# Patient Record
Sex: Male | Born: 1940 | Race: White | Hispanic: No | Marital: Married | Smoking: Current every day smoker
Health system: Southern US, Community
[De-identification: ages and names within clinical notes are randomized; demographics above are authoritative.]

## PROBLEM LIST (undated history)

## (undated) DIAGNOSIS — G459 Transient cerebral ischemic attack, unspecified: Secondary | ICD-10-CM

## (undated) DIAGNOSIS — I10 Essential (primary) hypertension: Secondary | ICD-10-CM

## (undated) DIAGNOSIS — F32A Depression, unspecified: Secondary | ICD-10-CM

## (undated) DIAGNOSIS — I639 Cerebral infarction, unspecified: Secondary | ICD-10-CM

## (undated) DIAGNOSIS — F329 Major depressive disorder, single episode, unspecified: Secondary | ICD-10-CM

## (undated) DIAGNOSIS — E7800 Pure hypercholesterolemia, unspecified: Secondary | ICD-10-CM

---

## 1999-03-01 ENCOUNTER — Encounter: Admission: RE | Admit: 1999-03-01 | Discharge: 1999-05-30 | Payer: Self-pay | Admitting: Orthopedic Surgery

## 2010-03-29 ENCOUNTER — Encounter: Payer: Self-pay | Admitting: Vascular Surgery

## 2015-02-23 ENCOUNTER — Observation Stay (HOSPITAL_COMMUNITY)
Admission: EM | Admit: 2015-02-23 | Discharge: 2015-02-24 | Disposition: A | Discharge status: Home / Self Care | Payer: Medicare Other | Attending: Internal Medicine | Admitting: Internal Medicine

## 2015-02-23 ENCOUNTER — Emergency Department (HOSPITAL_COMMUNITY): Payer: Medicare Other

## 2015-02-23 ENCOUNTER — Encounter (HOSPITAL_COMMUNITY): Payer: Self-pay | Admitting: Emergency Medicine

## 2015-02-23 DIAGNOSIS — F17210 Nicotine dependence, cigarettes, uncomplicated: Secondary | ICD-10-CM | POA: Diagnosis not present

## 2015-02-23 DIAGNOSIS — Z7982 Long term (current) use of aspirin: Secondary | ICD-10-CM | POA: Diagnosis not present

## 2015-02-23 DIAGNOSIS — D692 Other nonthrombocytopenic purpura: Secondary | ICD-10-CM | POA: Diagnosis not present

## 2015-02-23 DIAGNOSIS — S62609A Fracture of unspecified phalanx of unspecified finger, initial encounter for closed fracture: Secondary | ICD-10-CM

## 2015-02-23 DIAGNOSIS — I10 Essential (primary) hypertension: Secondary | ICD-10-CM | POA: Diagnosis present

## 2015-02-23 DIAGNOSIS — N400 Enlarged prostate without lower urinary tract symptoms: Secondary | ICD-10-CM | POA: Diagnosis not present

## 2015-02-23 DIAGNOSIS — S2232XA Fracture of one rib, left side, initial encounter for closed fracture: Secondary | ICD-10-CM | POA: Diagnosis not present

## 2015-02-23 DIAGNOSIS — Z9181 History of falling: Secondary | ICD-10-CM | POA: Insufficient documentation

## 2015-02-23 DIAGNOSIS — I2510 Atherosclerotic heart disease of native coronary artery without angina pectoris: Secondary | ICD-10-CM | POA: Diagnosis not present

## 2015-02-23 DIAGNOSIS — K219 Gastro-esophageal reflux disease without esophagitis: Secondary | ICD-10-CM | POA: Diagnosis not present

## 2015-02-23 DIAGNOSIS — W19XXXA Unspecified fall, initial encounter: Secondary | ICD-10-CM | POA: Diagnosis not present

## 2015-02-23 DIAGNOSIS — I951 Orthostatic hypotension: Secondary | ICD-10-CM | POA: Diagnosis present

## 2015-02-23 DIAGNOSIS — I252 Old myocardial infarction: Secondary | ICD-10-CM | POA: Insufficient documentation

## 2015-02-23 DIAGNOSIS — E871 Hypo-osmolality and hyponatremia: Secondary | ICD-10-CM | POA: Diagnosis not present

## 2015-02-23 DIAGNOSIS — Z8673 Personal history of transient ischemic attack (TIA), and cerebral infarction without residual deficits: Secondary | ICD-10-CM

## 2015-02-23 DIAGNOSIS — E785 Hyperlipidemia, unspecified: Secondary | ICD-10-CM | POA: Diagnosis not present

## 2015-02-23 DIAGNOSIS — S20319A Abrasion of unspecified front wall of thorax, initial encounter: Secondary | ICD-10-CM | POA: Diagnosis not present

## 2015-02-23 DIAGNOSIS — S62601A Fracture of unspecified phalanx of left index finger, initial encounter for closed fracture: Secondary | ICD-10-CM | POA: Insufficient documentation

## 2015-02-23 DIAGNOSIS — F329 Major depressive disorder, single episode, unspecified: Secondary | ICD-10-CM | POA: Diagnosis not present

## 2015-02-23 DIAGNOSIS — E7800 Pure hypercholesterolemia, unspecified: Secondary | ICD-10-CM | POA: Diagnosis not present

## 2015-02-23 DIAGNOSIS — R531 Weakness: Secondary | ICD-10-CM | POA: Diagnosis not present

## 2015-02-23 DIAGNOSIS — Z955 Presence of coronary angioplasty implant and graft: Secondary | ICD-10-CM | POA: Diagnosis not present

## 2015-02-23 DIAGNOSIS — R7989 Other specified abnormal findings of blood chemistry: Secondary | ICD-10-CM | POA: Diagnosis not present

## 2015-02-23 DIAGNOSIS — R55 Syncope and collapse: Secondary | ICD-10-CM | POA: Diagnosis not present

## 2015-02-23 DIAGNOSIS — R269 Unspecified abnormalities of gait and mobility: Secondary | ICD-10-CM | POA: Insufficient documentation

## 2015-02-23 DIAGNOSIS — F32A Depression, unspecified: Secondary | ICD-10-CM | POA: Diagnosis present

## 2015-02-23 HISTORY — DX: Transient cerebral ischemic attack, unspecified: G45.9

## 2015-02-23 HISTORY — DX: Cerebral infarction, unspecified: I63.9

## 2015-02-23 HISTORY — DX: Depression, unspecified: F32.A

## 2015-02-23 HISTORY — DX: Pure hypercholesterolemia, unspecified: E78.00

## 2015-02-23 HISTORY — DX: Essential (primary) hypertension: I10

## 2015-02-23 HISTORY — DX: Major depressive disorder, single episode, unspecified: F32.9

## 2015-02-23 LAB — URINALYSIS, ROUTINE W REFLEX MICROSCOPIC
Bilirubin Urine: NEGATIVE
GLUCOSE, UA: 250 mg/dL — AB
Hgb urine dipstick: NEGATIVE
KETONES UR: NEGATIVE mg/dL
LEUKOCYTES UA: NEGATIVE
NITRITE: NEGATIVE
PROTEIN: NEGATIVE mg/dL
Specific Gravity, Urine: 1.012 (ref 1.005–1.030)
pH: 6.5 (ref 5.0–8.0)

## 2015-02-23 LAB — CBC
HEMATOCRIT: 47.1 % (ref 39.0–52.0)
HEMOGLOBIN: 16.5 g/dL (ref 13.0–17.0)
MCH: 34.4 pg — ABNORMAL HIGH (ref 26.0–34.0)
MCHC: 35 g/dL (ref 30.0–36.0)
MCV: 98.3 fL (ref 78.0–100.0)
Platelets: 109 10*3/uL — ABNORMAL LOW (ref 150–400)
RBC: 4.79 MIL/uL (ref 4.22–5.81)
RDW: 13 % (ref 11.5–15.5)
WBC: 6.5 10*3/uL (ref 4.0–10.5)

## 2015-02-23 LAB — HEPATIC FUNCTION PANEL
ALBUMIN: 3.7 g/dL (ref 3.5–5.0)
ALK PHOS: 81 U/L (ref 38–126)
ALT: 29 U/L (ref 17–63)
AST: 25 U/L (ref 15–41)
BILIRUBIN INDIRECT: 1 mg/dL — AB (ref 0.3–0.9)
Bilirubin, Direct: 0.2 mg/dL (ref 0.1–0.5)
TOTAL PROTEIN: 6.7 g/dL (ref 6.5–8.1)
Total Bilirubin: 1.2 mg/dL (ref 0.3–1.2)

## 2015-02-23 LAB — BASIC METABOLIC PANEL
ANION GAP: 7 (ref 5–15)
BUN: 12 mg/dL (ref 6–20)
CHLORIDE: 98 mmol/L — AB (ref 101–111)
CO2: 26 mmol/L (ref 22–32)
Calcium: 9 mg/dL (ref 8.9–10.3)
Creatinine, Ser: 1.31 mg/dL — ABNORMAL HIGH (ref 0.61–1.24)
GFR calc Af Amer: >60 mL/min (ref >60)
GFR, EST NON AFRICAN AMERICAN: 52 mL/min — AB (ref >60)
Glucose, Bld: 145 mg/dL — ABNORMAL HIGH (ref 65–99)
POTASSIUM: 4.2 mmol/L (ref 3.5–5.1)
SODIUM: 131 mmol/L — AB (ref 135–145)

## 2015-02-23 LAB — OSMOLALITY: OSMOLALITY: 268 mosm/kg — AB (ref 275–295)

## 2015-02-23 LAB — I-STAT TROPONIN, ED: Troponin i, poc: 0 ng/mL (ref 0.00–0.08)

## 2015-02-23 LAB — TSH: TSH: 1.489 u[IU]/mL (ref 0.350–4.500)

## 2015-02-23 LAB — VITAMIN B12: VITAMIN B 12: 1078 pg/mL — AB (ref 180–914)

## 2015-02-23 MED ORDER — ENOXAPARIN SODIUM 30 MG/0.3ML ~~LOC~~ SOLN
30.0000 mg | SUBCUTANEOUS | Status: DC
  Administered 2015-02-23: 30 mg via SUBCUTANEOUS
  Filled 2015-02-23: qty 0.3

## 2015-02-23 MED ORDER — ATORVASTATIN CALCIUM 10 MG PO TABS
10.0000 mg | ORAL_TABLET | Freq: Every day | ORAL | Status: DC
  Administered 2015-02-23 – 2015-02-24 (×2): 10 mg via ORAL
  Filled 2015-02-23 (×2): qty 1

## 2015-02-23 MED ORDER — ACETAMINOPHEN 325 MG PO TABS: 650.0000 mg | ORAL_TABLET | Freq: Four times a day (QID) | ORAL | Status: DC | PRN

## 2015-02-23 MED ORDER — SODIUM CHLORIDE 0.9 % IV SOLN
INTRAVENOUS | Status: AC
  Administered 2015-02-23: 22:00:00 via INTRAVENOUS

## 2015-02-23 MED ORDER — SODIUM CHLORIDE 0.9% FLUSH
3.0000 mL | Freq: Two times a day (BID) | INTRAVENOUS | Status: DC
  Administered 2015-02-23 – 2015-02-24 (×2): 3 mL via INTRAVENOUS

## 2015-02-23 MED ORDER — ACETAMINOPHEN 650 MG RE SUPP: 650.0000 mg | Freq: Four times a day (QID) | RECTAL | Status: DC | PRN

## 2015-02-23 MED ORDER — VITAMIN B-12 1000 MCG PO TABS: 1000.0000 ug | ORAL_TABLET | Freq: Every day | ORAL | Status: DC

## 2015-02-23 MED ORDER — AMITRIPTYLINE HCL 25 MG PO TABS
75.0000 mg | ORAL_TABLET | Freq: Every day | ORAL | Status: DC
  Administered 2015-02-23: 75 mg via ORAL
  Filled 2015-02-23: qty 3

## 2015-02-23 MED ORDER — ESCITALOPRAM OXALATE 10 MG PO TABS
20.0000 mg | ORAL_TABLET | Freq: Every day | ORAL | Status: DC
  Administered 2015-02-23 – 2015-02-24 (×2): 20 mg via ORAL
  Filled 2015-02-23 (×2): qty 2

## 2015-02-23 MED ORDER — TRAZODONE HCL 100 MG PO TABS
100.0000 mg | ORAL_TABLET | Freq: Every day | ORAL | Status: DC
  Administered 2015-02-23: 100 mg via ORAL
  Filled 2015-02-23: qty 1

## 2015-02-23 MED ORDER — SODIUM CHLORIDE 0.9 % IV BOLUS (SEPSIS)
1000.0000 mL | Freq: Once | INTRAVENOUS | Status: AC
  Administered 2015-02-23: 1000 mL via INTRAVENOUS

## 2015-02-23 MED ORDER — PANTOPRAZOLE SODIUM 40 MG PO TBEC
40.0000 mg | DELAYED_RELEASE_TABLET | Freq: Every day | ORAL | Status: DC
  Administered 2015-02-23 – 2015-02-24 (×2): 40 mg via ORAL
  Filled 2015-02-23 (×2): qty 1

## 2015-02-23 MED ORDER — ASPIRIN EC 81 MG PO TBEC
81.0000 mg | DELAYED_RELEASE_TABLET | Freq: Every day | ORAL | Status: DC
  Administered 2015-02-23: 81 mg via ORAL
  Filled 2015-02-23: qty 1

## 2015-02-23 NOTE — ED Notes (Signed)
Ordered heart healthy meal tray. 

## 2015-02-23 NOTE — H&P (Signed)
Date: 02/23/2015               Patient Name:  Hector Petersen MRN: [id number]  DOB: [birth year] Age / Sex: 75 y.o., male   PCP: No primary care provider on file.         Medical Service: Internal Medicine Teaching Service         Attending Physician: Dr. Doneen Poisson, MD    First Contact: Dr. Deneise Lever Pager: [phone]  Second Contact: Dr. Jill Alexanders Pager: [phone]       After Hours (After 5p/  First Contact Pager: [phone]  weekends / holidays): Second Contact Pager: [phone]   Chief Complaint: syncopal episode   History of Present Illness:   Hector Petersen is a 75 yo man with history of HTN, HLD, Depression, who is here for a syncopal episode last night. Son present at bedside who also provided the info. Patient states last night he was walking to the bathroom, lost consciousness for few seconds, and fell. He was sitting in the recliner watching TV, and then he got up and while on the way to the bathroom, he felt like this.  Denies b/b incontinence or arm twitching. Prior to the fall, patient was sitting watching television. Patient has a history of falls, which increased for last 6 months, but has never passed out before. He denies any associated chest pain or palpitations prior the event. He denies sensation of room spinning. He does admit to a recent history of productive cough with clear sputum. He has not been eating and drinking "like he used to." He says he drank plenty of water yesterday. He currently weighs 225 but several months ago weight about 240 pounds. He denies associated shortness of breath. He denies lightheadedness or dizziness prior to the fall. His wife witness the loss of consciousness and fall. Patient's face hit the floor. He normally walks around by himself. Patient has a walker and cane. Wife is currently battling stomach cancer.  He denies dizziness, headaches, SOB, n/v/d/c, melena or hematochezia. No blood loss. He does have some pain on the right side of  chest that was after the fall. He denies any fever or chills. He denies numbness or tingling but mentions that his bottom of his feet felt numb for past few months.  He also says that he feels unsteady when he walks and that has resulted in a lot of falls.  In the last 6 months, he has fallen several times. He has been evaluated by PT before and he has a walker at home, but he does not like using a walker, and likes to walk by himself.   In the ER, he was found to be orthostatic and given 1L fluids. CT head was WNL. EKG showed sinus tach.    Social Hx: Patient lives at home with his wife. He admits to alcohol use- usually one drink a day either beer or wine. He admits to 1 ppd of tobacco use for the last one year. He previously smoked for 40 years then quit, but has been smoking for the past year.   PMHx: HTN, h/o CVA, Depression, HLD  Family Hx: Sister- Hypothyroidism   Meds: Current Facility-Administered Medications  Medication Dose Route Frequency Provider Last Rate Last Dose  . 0.9 %  sodium chloride infusion   Intravenous Continuous Alexa Dulcy Fanny, MD      . acetaminophen (TYLENOL) tablet 650 mg  650 mg Oral Q6H PRN Alexa Dulcy Fanny, MD  Or  . acetaminophen (TYLENOL) suppository 650 mg  650 mg Rectal Q6H PRN Alexa Dulcy Fanny, MD      . amitriptyline (ELAVIL) tablet 75 mg  75 mg Oral QHS Alexa Dulcy Fanny, MD      . aspirin EC tablet 81 mg  81 mg Oral Daily Alexa Dulcy Fanny, MD      . atorvastatin (LIPITOR) tablet 10 mg  10 mg Oral Daily Alexa Dulcy Fanny, MD      . enoxaparin (LOVENOX) injection 30 mg  30 mg Subcutaneous Q24H Alexa Dulcy Fanny, MD      . escitalopram (LEXAPRO) tablet 20 mg  20 mg Oral Daily Alexa Dulcy Fanny, MD      . pantoprazole (PROTONIX) EC tablet 40 mg  40 mg Oral Daily Alexa Dulcy Fanny, MD      . sodium chloride flush (NS) 0.9 % injection 3 mL  3 mL Intravenous Q12H Alexa Dulcy Fanny, MD      . traZODone (DESYREL) tablet 100 mg  100 mg  Oral QHS Alexa Dulcy Fanny, MD      . vitamin B-12 (CYANOCOBALAMIN) tablet 1,000 mcg  1,000 mcg Oral Daily Alexa Dulcy Fanny, MD        Allergies: Allergies as of 02/23/2015  . (No Known Allergies)   Past Medical History  Diagnosis Date  . Hypertension   . Hypercholesteremia   . Depression   . TIA (transient ischemic attack)   . Stroke [facility])    No past surgical history on file. No family history on file. Social History   Social History  . Marital Status: Married    Spouse Name: N/A  . Number of Children: N/A  . Years of Education: N/A   Occupational History  . Not on file.   Social History Main Topics  . Smoking status: Current Every Day Smoker  . Smokeless tobacco: Not on file  . Alcohol Use: Yes  . Drug Use: No  . Sexual Activity: Not on file   Other Topics Concern  . Not on file   Social History Narrative  . No narrative on file    Review of Systems: Pertinent items noted in HPI and remainder of comprehensive ROS otherwise negative.  Physical Exam: Blood pressure 140/82, pulse 83, temperature 98.3 F (36.8 C), temperature source Oral, resp. rate 18, SpO2 98 %.  General: Vital signs reviewed. Patient in no acute distress HEENT: left forehead has some swelling Cardiovascular: regular rate, rhythm, no murmur appreciated     Pulmonary/Chest: Clear to auscultation bilaterally, no wheezes, rales, or rhonchi. Abdominal: Soft, non-tender, non-distended, BS + Extremities: trace lower extremity edema bilaterally, pulses symmetric and intact bilaterally. Skin: has some bruising on shins and on chest.   Neurologic exam: MS: A&O to person, time, place,  CN II-XII grossly intact DTRs: 2+ and symmetric Sensory: intact to light touch and feels same on both sides Motor: 5/5 strength in upper, 5/5 in lower extremities, normal muscle tone Cerebellar: , normal FNF,  Psyc: pleasant personality, affect appropriate to mood, no focal deficits     Lab  results: Results for orders placed or performed during the hospital encounter of 02/23/15 (from the past 24 hour(s))  Basic metabolic panel     Status: Abnormal   Collection Time: 02/23/15 11:19 AM  Result Value Ref Range   Sodium 131 (L) 135 - 145 mmol/L   Potassium 4.2 3.5 - 5.1 mmol/L   Chloride 98 (L) 101 - 111 mmol/L   CO2 26  22 - 32 mmol/L   Glucose, Bld 145 (H) 65 - 99 mg/dL   BUN 12 6 - 20 mg/dL   Creatinine, Ser 0.98 (H) 0.61 - 1.24 mg/dL   Calcium 9.0 8.9 - 11.9 mg/dL   GFR calc non Af Amer 52 (L) >60 mL/min   GFR calc Af Amer >60 >60 mL/min   Anion gap 7 5 - 15  CBC     Status: Abnormal   Collection Time: 02/23/15 11:19 AM  Result Value Ref Range   WBC 6.5 4.0 - 10.5 K/uL   RBC 4.79 4.22 - 5.81 MIL/uL   Hemoglobin 16.5 13.0 - 17.0 g/dL   HCT 14.7 82.9 - 56.2 %   MCV 98.3 78.0 - 100.0 fL   MCH 34.4 (H) 26.0 - 34.0 pg   MCHC 35.0 30.0 - 36.0 g/dL   RDW 13.0 86.5 - 78.4 %   Platelets 109 (L) 150 - 400 K/uL  Hepatic function panel     Status: Abnormal   Collection Time: 02/23/15 11:20 AM  Result Value Ref Range   Total Protein 6.7 6.5 - 8.1 g/dL   Albumin 3.7 3.5 - 5.0 g/dL   AST 25 15 - 41 U/L   ALT 29 17 - 63 U/L   Alkaline Phosphatase 81 38 - 126 U/L   Total Bilirubin 1.2 0.3 - 1.2 mg/dL   Bilirubin, Direct 0.2 0.1 - 0.5 mg/dL   Indirect Bilirubin 1.0 (H) 0.3 - 0.9 mg/dL  I-Stat Troponin, ED (not at [facility])     Status: None   Collection Time: 02/23/15 11:27 AM  Result Value Ref Range   Troponin i, poc 0.00 0.00 - 0.08 ng/mL   Comment 3          Urinalysis, Routine w reflex microscopic     Status: Abnormal   Collection Time: 02/23/15  1:00 PM  Result Value Ref Range   Color, Urine YELLOW YELLOW   APPearance CLOUDY (A) CLEAR   Specific Gravity, Urine 1.012 1.005 - 1.030   pH 6.5 5.0 - 8.0   Glucose, UA 250 (A) NEGATIVE mg/dL   Hgb urine dipstick NEGATIVE NEGATIVE   Bilirubin Urine NEGATIVE NEGATIVE   Ketones, ur NEGATIVE NEGATIVE mg/dL   Protein, ur  NEGATIVE NEGATIVE mg/dL   Nitrite NEGATIVE NEGATIVE   Leukocytes, UA NEGATIVE NEGATIVE     Imaging results:  Dg Chest 2 View  02/23/2015  CLINICAL DATA:  Fall last night, face and arms abrasion however EXAM: CHEST  2 VIEW COMPARISON:  11/26/2014 FINDINGS: Cardiomediastinal silhouette is stable. Again noted old fracture of the left fifth rib. No acute infiltrate or pulmonary edema. Osteopenia and mild degenerative changes thoracic spine. No pneumothorax. IMPRESSION: No active disease. Again noted old fracture of the left fifth rib. No infiltrate or pulmonary edema. Electronically Signed   By: Natasha Mead M.D.   On: 02/23/2015 12:17   Ct Head Wo Contrast  02/23/2015  CLINICAL DATA:  Syncope last night with a fall and blow to the face. Initial encounter. EXAM: CT HEAD WITHOUT CONTRAST CT CERVICAL SPINE WITHOUT CONTRAST TECHNIQUE: Multidetector CT imaging of the head and cervical spine was performed following the standard protocol without intravenous contrast. Multiplanar CT image reconstructions of the cervical spine were also generated. COMPARISON:  Head CT scan 05/25/2012. FINDINGS: CT HEAD FINDINGS Hematoma is seen above the left eye. There is some cortical atrophy and chronic microvascular ischemic change. No evidence of acute intracranial abnormality including hemorrhage, infarct, mass lesion, mass effect,  midline shift or abnormal extra-axial fluid collection is identified. 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 No pneumocephalus or hydrocephalus. The calvarium is intact. Mucosal thickening is seen in the maxillary sinuses bilaterally and scattered ethmoid air cells. CT CERVICAL SPINE FINDINGS No fracture or malalignment cervical spine is identified. Mild loss of disc space height and endplate spurring are seen at C5-6. Note is made that the occipital condyles and lateral masses of C1 are autologously fused. Lung apices are clear. Paraspinous structures are  unremarkable. IMPRESSION: Hematoma above the left eye without underlying fracture or acute intracranial abnormality. No acute abnormality cervical spine. Atrophy and chronic microvascular ischemic change. Circumferential mucosal thickening maxillary sinuses bilaterally. Mild degenerative disease C5-6. Electronically Signed   By: Drusilla Kanner M.D.   On: 02/23/2015 12:17   Ct Cervical Spine Wo Contrast  02/23/2015  CLINICAL DATA:  Syncope last night with a fall and blow to the face. Initial encounter. EXAM: CT HEAD WITHOUT CONTRAST CT CERVICAL SPINE WITHOUT CONTRAST TECHNIQUE: Multidetector CT imaging of the head and cervical spine was performed following the standard protocol without intravenous contrast. Multiplanar CT image reconstructions of the cervical spine were also generated. COMPARISON:  Head CT scan 05/25/2012. FINDINGS: CT HEAD FINDINGS Hematoma is seen above the left eye. There is some cortical atrophy and chronic microvascular ischemic change. No evidence of acute intracranial abnormality including hemorrhage, infarct, mass lesion, mass effect, midline shift or abnormal extra-axial fluid collection is identified. 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 No pneumocephalus or hydrocephalus. The calvarium is intact. Mucosal thickening is seen in the maxillary sinuses bilaterally and scattered ethmoid air cells. CT CERVICAL SPINE FINDINGS No fracture or malalignment cervical spine is identified. Mild loss of disc space height and endplate spurring are seen at C5-6. Note is made that the occipital condyles and lateral masses of C1 are autologously fused. Lung apices are clear. Paraspinous structures are unremarkable. IMPRESSION: Hematoma above the left eye without underlying fracture or acute intracranial abnormality. No acute abnormality cervical spine. Atrophy and chronic microvascular ischemic change. Circumferential mucosal thickening maxillary sinuses  bilaterally. Mild degenerative disease C5-6. Electronically Signed   By: Drusilla Kanner M.D.   On: 02/23/2015 12:17    Other results:   Assessment & Plan by Problem: Active Problems:   Syncope and collapse  Syncopal episode leading to fall : Pt with a syncopal episode that had associated loss of consciousness for few seconds leading to a fall witnessed by wife. Differentials would be vasovagal, orthostatic, cardiac etiology, or neurologic. There were no preceding symptoms but denied palpitations or chest pain so could be cardiac etiology. No room spinning sensation so unlikely to be BPPV, and no pallor or diaphoresis upon getting up from the recliner so unlikely to be vasovagal   Pt says he was adequately hydrated yesterday and had his normal meals. He also denied any extremity weakness. So concern for cardiogenic syncope. Unlikely to be CVA or PE.  In the ER< he was found to be orthostatic and given 1 L fluid. CT head was unremarkable. CT spine was unremarkable. Chest xray shows old fracture of left fifth rib . Neuro exam was entirely unremarkable. EKG showed sinus tach and some ST abnormality but no overt T wave inversion or st changes.   -telemetry -NS 100 cc/hr, received 1 L bolus -PT and OT eval and treat   Follow up: -repeat CBC and BMET  -follow up TSH -PT and OT eval  -  echo -morning EKG follow up    Recurrent mechanical falls: Pt who has fallen multiple times in the past 6 months due to "his legs giving out" Has a walker and cane at home but did not want to use it. Physical exam reveals multiple bruising and band-aids. No concern for abuse at home as son seemed very supportive.  Chest xray shows old fracture of left fifth rib   -PT and OT eval -f/u Vit B12   Depression: currently lot of things going on in family- wife dx with gastric ca.  Could have contributed to the fall.  -elavil 75 daily at bedtime  Trazodone for sleep -lexapro  Hyponatremia: to 131. Asymptomatic . No  priors for comparison  -on NS 100 cc/hr -repeat BMET  -serum osms calculating  , urine Na and Urine osms  Elevated Creatinine: to 1.31. No baseline  -repeat BMET -FeNa   GERD -protonix  HLD -lipitor 10 mg daily    Dispo: Disposition is deferred at this time, awaiting improvement of current medical problems. Anticipated discharge in approximately 2 day(s).   The patient does have a current PCP (No primary care provider on file.) and does need an [facility] hospital follow-up appointment after discharge.  The patient does not have transportation limitations that hinder transportation to clinic appointments.  Signed: Deneise Lever, MD 02/23/2015, 6:51 PM

## 2015-02-23 NOTE — ED Provider Notes (Signed)
CSN: [id number]     Arrival date & time 02/23/15  1109 History   First MD Initiated Contact with Patient 02/23/15 1121     Chief Complaint  Patient presents with  . Loss of Consciousness     Patient is a 75 y.o. male presenting with syncope. The history is provided by the patient. No language interpreter was used.  Loss of Consciousness  MACOY RODWELL is a 75 y.o. male who presents to the Emergency Department complaining of syncope.   History is provided by the patient and his wife. Last night he was walking to the bathroom and he had a syncopal event landing on his face. He had very brief loss of consciousness. He had no preceding symptoms. He reports pain to his face. His family doctor was called today and they told him to present to the ED for evaluation. Current upper respiratory infection with prednsone and antibiotics one week ago. He's had a cough but feels like this is improving. He does have some left-sided chest discomfort related to prior fall. He denies any fevers, hemoptysis, leg swelling or pain.  Past Medical History  Diagnosis Date  . Hypertension   . Hypercholesteremia   . Depression   . TIA (transient ischemic attack)   . Stroke [facility])    No past surgical history on file. No family history on file. Social History  Substance Use Topics  . Smoking status: Current Every Day Smoker  . Smokeless tobacco: Not on file  . Alcohol Use: Yes    Review of Systems  Cardiovascular: Positive for syncope.  All other systems reviewed and are negative.     Allergies  Review of patient's allergies indicates no known allergies.  Home Medications   Prior to Admission medications   Medication Sig Start Date End Date Taking? Authorizing Provider  amitriptyline (ELAVIL) 50 MG tablet Take 75 mg by mouth at bedtime.   Yes Historical Provider, MD  aspirin EC 81 MG tablet Take 81 mg by mouth daily.   Yes Historical Provider, MD  atorvastatin (LIPITOR) 10 MG tablet Take 10 mg by  mouth daily.   Yes Historical Provider, MD  escitalopram (LEXAPRO) 20 MG tablet Take 20 mg by mouth daily.   Yes Historical Provider, MD  lisinopril (PRINIVIL,ZESTRIL) 20 MG tablet Take 20 mg by mouth daily.   Yes Historical Provider, MD  Omega-3 Fatty Acids (FISH OIL) 1000 MG CAPS Take 1,000 mg by mouth daily.   Yes Historical Provider, MD  omeprazole (PRILOSEC) 20 MG capsule Take 20 mg by mouth daily.   Yes Historical Provider, MD  tamsulosin (FLOMAX) 0.4 MG CAPS capsule Take 0.4 mg by mouth at bedtime.   Yes Historical Provider, MD  traZODone (DESYREL) 100 MG tablet Take 100-200 mg by mouth at bedtime.   Yes Historical Provider, MD  vitamin B-12 (CYANOCOBALAMIN) 1000 MCG tablet Take 1,000 mcg by mouth daily.   Yes Historical Provider, MD   BP 158/84 mmHg  Pulse 84  Temp(Src) 98.6 F (37 C) (Oral)  Resp 18  SpO2 97% Physical Exam  Constitutional: He is oriented to person, place, and time. He appears well-developed and well-nourished.  HENT:  Head: Normocephalic.   Small amount of swelling and ecchymosis to the left forehead.  Cardiovascular: Regular rhythm.   No murmur heard. tachycardic  Pulmonary/Chest: Effort normal and breath sounds normal. No respiratory distress.  Abdominal: Soft. There is no tenderness. There is no rebound and no guarding.  Musculoskeletal: He exhibits no edema or  tenderness.  Neurological: He is alert and oriented to person, place, and time. No cranial nerve deficit.   5 out of 5 strength in all 4 extremities  Skin: Skin is warm and dry.  Psychiatric: He has a normal mood and affect. His behavior is normal.  Nursing note and vitals reviewed.   ED Course  Procedures (including critical care time) Labs Review Labs Reviewed  BASIC METABOLIC PANEL - Abnormal; Notable for the following:    Sodium 131 (*)    Chloride 98 (*)    Glucose, Bld 145 (*)    Creatinine, Ser 1.31 (*)    GFR calc non Af Amer 52 (*)    All other components within normal limits   CBC - Abnormal; Notable for the following:    MCH 34.4 (*)    Platelets 109 (*)    All other components within normal limits  HEPATIC FUNCTION PANEL - Abnormal; Notable for the following:    Indirect Bilirubin 1.0 (*)    All other components within normal limits  URINALYSIS, ROUTINE W REFLEX MICROSCOPIC (NOT AT [facility]) - Abnormal; Notable for the following:    APPearance CLOUDY (*)    Glucose, UA 250 (*)    All other components within normal limits  I-STAT TROPOININ, ED    Imaging Review Dg Chest 2 View  02/23/2015  CLINICAL DATA:  Fall last night, face and arms abrasion however EXAM: CHEST  2 VIEW COMPARISON:  11/26/2014 FINDINGS: Cardiomediastinal silhouette is stable. Again noted old fracture of the left fifth rib. No acute infiltrate or pulmonary edema. Osteopenia and mild degenerative changes thoracic spine. No pneumothorax. IMPRESSION: No active disease. Again noted old fracture of the left fifth rib. No infiltrate or pulmonary edema. Electronically Signed   By: Natasha Mead M.D.   On: 02/23/2015 12:17   Ct Head Wo Contrast  02/23/2015  CLINICAL DATA:  Syncope last night with a fall and blow to the face. Initial encounter. EXAM: CT HEAD WITHOUT CONTRAST CT CERVICAL SPINE WITHOUT CONTRAST TECHNIQUE: Multidetector CT imaging of the head and cervical spine was performed following the standard protocol without intravenous contrast. Multiplanar CT image reconstructions of the cervical spine were also generated. COMPARISON:  Head CT scan 05/25/2012. FINDINGS: CT HEAD FINDINGS Hematoma is seen above the left eye. There is some cortical atrophy and chronic microvascular ischemic change. No evidence of acute intracranial abnormality including hemorrhage, infarct, mass lesion, mass effect, midline shift or abnormal extra-axial fluid collection is identified. 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 No pneumocephalus or hydrocephalus. The calvarium is intact. Mucosal  thickening is seen in the maxillary sinuses bilaterally and scattered ethmoid air cells. CT CERVICAL SPINE FINDINGS No fracture or malalignment cervical spine is identified. Mild loss of disc space height and endplate spurring are seen at C5-6. Note is made that the occipital condyles and lateral masses of C1 are autologously fused. Lung apices are clear. Paraspinous structures are unremarkable. IMPRESSION: Hematoma above the left eye without underlying fracture or acute intracranial abnormality. No acute abnormality cervical spine. Atrophy and chronic microvascular ischemic change. Circumferential mucosal thickening maxillary sinuses bilaterally. Mild degenerative disease C5-6. Electronically Signed   By: Drusilla Kanner M.D.   On: 02/23/2015 12:17   Ct Cervical Spine Wo Contrast  02/23/2015  CLINICAL DATA:  Syncope last night with a fall and blow to the face. Initial encounter. EXAM: CT HEAD WITHOUT CONTRAST CT CERVICAL SPINE WITHOUT CONTRAST TECHNIQUE: Multidetector CT imaging of the head and  cervical spine was performed following the standard protocol without intravenous contrast. Multiplanar CT image reconstructions of the cervical spine were also generated. COMPARISON:  Head CT scan 05/25/2012. FINDINGS: CT HEAD FINDINGS Hematoma is seen above the left eye. There is some cortical atrophy and chronic microvascular ischemic change. No evidence of acute intracranial abnormality including hemorrhage, infarct, mass lesion, mass effect, midline shift or abnormal extra-axial fluid collection is identified. 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 No pneumocephalus or hydrocephalus. The calvarium is intact. Mucosal thickening is seen in the maxillary sinuses bilaterally and scattered ethmoid air cells. CT CERVICAL SPINE FINDINGS No fracture or malalignment cervical spine is identified. Mild loss of disc space height and endplate spurring are seen at C5-6. Note is made that the  occipital condyles and lateral masses of C1 are autologously fused. Lung apices are clear. Paraspinous structures are unremarkable. IMPRESSION: Hematoma above the left eye without underlying fracture or acute intracranial abnormality. No acute abnormality cervical spine. Atrophy and chronic microvascular ischemic change. Circumferential mucosal thickening maxillary sinuses bilaterally. Mild degenerative disease C5-6. Electronically Signed   By: Drusilla Kanner M.D.   On: 02/23/2015 12:17   I have personally reviewed and evaluated these images and lab results as part of my medical decision-making.   EKG Interpretation   Date/Time:  Tuesday February 23 2015 11:17:33 EST Ventricular Rate:  105 PR Interval:  178 QRS Duration: 108 QT Interval:  318 QTC Calculation: 420 R Axis:   50 Text Interpretation:  Sinus tachycardia ST \\T \ T wave abnormality,  consider inferior ischemia Abnormal ECG Confirmed by Lincoln Brigham [phone]) on  02/23/2015 12:03:19 PM      MDM   Final diagnoses:  Syncope and collapse    patient here for evaluation of injuries following a syncopal event last night. He does have a frontal contusion on examination. No evidence of intracranial abnormality on CT scan. Labs are similar compared to priors.  Patient is orthostatic in the department and mildly tachycardic on ED arrival. His heart rates improved after IV fluid bolus.Concern for potential cardiogenic syncope given his lack of preceding symptoms. Discuss with medicine regarding admission for observation for further evaluation.   Tilden Fossa, MD 02/23/15 1622

## 2015-02-23 NOTE — ED Notes (Signed)
Pt had syncope last night with fall onto face; pt with cuts and abrasion to arms and bridge of nose

## 2015-02-24 ENCOUNTER — Other Ambulatory Visit: Payer: Self-pay | Admitting: Internal Medicine

## 2015-02-24 ENCOUNTER — Ambulatory Visit (HOSPITAL_BASED_OUTPATIENT_CLINIC_OR_DEPARTMENT_OTHER): Payer: Medicare Other

## 2015-02-24 ENCOUNTER — Observation Stay (HOSPITAL_COMMUNITY): Payer: Medicare Other

## 2015-02-24 ENCOUNTER — Other Ambulatory Visit: Payer: Self-pay

## 2015-02-24 DIAGNOSIS — S62609A Fracture of unspecified phalanx of unspecified finger, initial encounter for closed fracture: Secondary | ICD-10-CM | POA: Insufficient documentation

## 2015-02-24 DIAGNOSIS — D692 Other nonthrombocytopenic purpura: Secondary | ICD-10-CM | POA: Diagnosis present

## 2015-02-24 DIAGNOSIS — R55 Syncope and collapse: Secondary | ICD-10-CM | POA: Diagnosis not present

## 2015-02-24 DIAGNOSIS — I10 Essential (primary) hypertension: Secondary | ICD-10-CM | POA: Diagnosis not present

## 2015-02-24 DIAGNOSIS — E785 Hyperlipidemia, unspecified: Secondary | ICD-10-CM | POA: Diagnosis not present

## 2015-02-24 DIAGNOSIS — Z8673 Personal history of transient ischemic attack (TIA), and cerebral infarction without residual deficits: Secondary | ICD-10-CM | POA: Diagnosis not present

## 2015-02-24 LAB — CBC
HEMATOCRIT: 41.2 % (ref 39.0–52.0)
Hemoglobin: 14.6 g/dL (ref 13.0–17.0)
MCH: 34.6 pg — AB (ref 26.0–34.0)
MCHC: 35.4 g/dL (ref 30.0–36.0)
MCV: 97.6 fL (ref 78.0–100.0)
Platelets: 74 10*3/uL — ABNORMAL LOW (ref 150–400)
RBC: 4.22 MIL/uL (ref 4.22–5.81)
RDW: 12.9 % (ref 11.5–15.5)
WBC: 3.7 10*3/uL — AB (ref 4.0–10.5)

## 2015-02-24 LAB — BASIC METABOLIC PANEL
ANION GAP: 6 (ref 5–15)
BUN: 10 mg/dL (ref 6–20)
CHLORIDE: 99 mmol/L — AB (ref 101–111)
CO2: 26 mmol/L (ref 22–32)
Calcium: 8.5 mg/dL — ABNORMAL LOW (ref 8.9–10.3)
Creatinine, Ser: 1.16 mg/dL (ref 0.61–1.24)
GFR calc Af Amer: >60 mL/min (ref >60)
GLUCOSE: 115 mg/dL — AB (ref 65–99)
POTASSIUM: 4.5 mmol/L (ref 3.5–5.1)
Sodium: 131 mmol/L — ABNORMAL LOW (ref 135–145)

## 2015-02-24 LAB — SODIUM, URINE, RANDOM: Sodium, Ur: 49 mmol/L

## 2015-02-24 LAB — CREATININE, URINE, RANDOM: Creatinine, Urine: 47.93 mg/dL

## 2015-02-24 LAB — OSMOLALITY, URINE: OSMOLALITY UR: 221 mosm/kg — AB (ref 300–900)

## 2015-02-24 MED ORDER — LISINOPRIL 20 MG PO TABS: 10.0000 mg | ORAL_TABLET | Freq: Every day | ORAL | Status: AC

## 2015-02-24 NOTE — Evaluation (Signed)
Physical Therapy Evaluation Patient Details Name: Hector Petersen MRN: [id number] DOB: March 17, 1940 Today's Date: 02/24/2015   History of Present Illness  Hector Petersen is a 75 yo man with history of HTN, HLD, Depression, who is here for a syncopal episode last night.  He also says that he feels unsteady when he walks and that has resulted in a lot of falls.  Chest xray shows old fracture of left fifth rib   Clinical Impression  Patient presents with decreased mobility due to deficits listed in PT problem list.  He will benefit from skilled PT in the outpatient setting to address deficits in balance and safety and minimize fall risk.     Follow Up Recommendations Outpatient PT    Equipment Recommendations  None recommended by PT    Recommendations for Other Services       Precautions / Restrictions Precautions Precautions: Fall Precaution Comments: Hector Petersen reports 3 falls in January       Mobility  Bed Mobility Overal bed mobility: Needs Assistance Bed Mobility: Supine to Sit     Supine to sit: Supervision     General bed mobility comments: increased time and UE assist needed on rail  Transfers Overall transfer level: Needs assistance   Transfers: Sit to/from Stand Sit to Stand: Supervision         General transfer comment: assist for safety, no LOB, UE support on bed for transfer  Ambulation/Gait Ambulation/Gait assistance: Supervision Ambulation Distance (Feet): 200 Feet Assistive device: None Gait Pattern/deviations: Step-through pattern     General Gait Details: see DGI  Stairs Stairs: Yes Stairs assistance: Supervision Stair Management: Alternating pattern;Two rails;One rail Right;Forwards Number of Stairs: 4 General stair comments: one rail to ascend, two to descend  Wheelchair Mobility    Modified Rankin (Stroke Patients Only)       Balance                                 Standardized Balance Assessment Standardized Balance  Assessment : Berg Balance Test;Dynamic Gait Index Berg Balance Test Sit to Stand: Able to stand  independently using hands Standing Unsupported: Able to stand safely 2 minutes Sitting with Back Unsupported but Feet Supported on Floor or Stool: Able to sit safely and securely 2 minutes Stand to Sit: Sits safely with minimal use of hands Transfers: Able to transfer safely, minor use of hands Standing Unsupported with Eyes Closed: Able to stand 10 seconds with supervision Standing Ubsupported with Feet Together: Able to place feet together independently and stand for 1 minute with supervision From Standing, Reach Forward with Outstretched Arm: Can reach confidently >25 cm (10") From Standing Position, Pick up Object from Floor: Unable to pick up and needs supervision From Standing Position, Turn to Look Behind Over each Shoulder: Looks behind from both sides and weight shifts well Turn 360 Degrees: Able to turn 360 degrees safely but slowly Standing Unsupported, Alternately Place Feet on Step/Stool: Able to complete 4 steps without aid or supervision Standing Unsupported, One Foot in Front: Able to plae foot ahead of the other independently and hold 30 seconds Standing on One Leg: Tries to lift leg/unable to hold 3 seconds but remains standing independently Total Score: 42 Dynamic Gait Index Level Surface: Normal Change in Gait Speed: Mild Impairment Gait with Horizontal Head Turns: Mild Impairment Gait with Vertical Head Turns: Normal Gait and Pivot Turn: Normal Step Over Obstacle: Normal Step Around Obstacles: Normal  Steps: Mild Impairment Total Score: 21       Pertinent Vitals/Pain Pain Assessment: No/denies pain    Home Living Family/patient expects to be discharged to:: Private residence Living Arrangements: Spouse/significant other Available Help at Discharge: Family Type of Home: House Home Access: Stairs to enter Entrance Stairs-Rails: None (son reports can install  rails) Entrance Stairs-Number of Steps: 3 Home Layout: Able to live on main level with bedroom/bathroom;Laundry or work area in basement;One level Home Equipment: Grab bars - tub/shower;Cane - single point;Walker - 2 wheels;Walker - 4 wheels      Prior Function Level of Independence: Independent               Hand Dominance   Dominant Hand: Right    Extremity/Trunk Assessment               Lower Extremity Assessment: RLE deficits/detail;LLE deficits/detail RLE Deficits / Details: AROM WFL, strength hip flexion 4-/5, knee extension 4+/5, ankle DF 4+/5, coordination intact to toe tapping and heel to shin LLE Deficits / Details: AROM WFL, strength hip flexion 4/5, knee extension 4+/5, ankle DF 4+/5, coordination intact to toe tapping and heel to shin     Communication   Communication: No difficulties  Cognition Arousal/Alertness: Awake/alert Behavior During Therapy: WFL for tasks assessed/performed Overall Cognitive Status: Within Functional Limits for tasks assessed                      General Comments General comments (skin integrity, edema, etc.): Patient educated to use cane for fall prevention and educated in importance of footwear, lighting and keeping paths clear    Exercises        Assessment/Plan    PT Assessment All further PT needs can be met in the next venue of care  PT Diagnosis Abnormality of gait   PT Problem List Decreased balance;Decreased mobility;Decreased knowledge of use of DME  PT Treatment Interventions     PT Goals (Current goals can be found in the Care Plan section) Acute Rehab PT Goals PT Goal Formulation: All assessment and education complete, DC therapy    Frequency     Barriers to discharge        Co-evaluation               End of Session Equipment Utilized During Treatment: Gait belt Activity Tolerance: Patient tolerated treatment well Patient left: in bed;with family/visitor present;with call bell/phone  within reach (sitting EOB with lunch tray set up)      Functional Assessment Tool Used: Clinical Judgement Functional Limitation: Mobility: Walking and moving around Mobility: Walking and Moving Around Current Status (Y1950): At least 1 percent but less than 20 percent impaired, limited or restricted Mobility: Walking and Moving Around Goal Status [phone]): At least 1 percent but less than 20 percent impaired, limited or restricted Mobility: Walking and Moving Around Discharge Status [phone]): At least 1 percent but less than 20 percent impaired, limited or restricted    Time: 1135-1201 PT Time Calculation (min) (ACUTE ONLY): 26 min   Charges:   PT Evaluation $PT Eval Moderate Complexity: 1 Procedure PT Treatments $Gait Training: 8-22 mins   PT G Codes:   PT G-Codes **NOT FOR INPATIENT CLASS** Functional Assessment Tool Used: Clinical Judgement Functional Limitation: Mobility: Walking and moving around Mobility: Walking and Moving Around Current Status (K9983): At least 1 percent but less than 20 percent impaired, limited or restricted Mobility: Walking and Moving Around Goal Status [phone]): At least 1 percent  but less than 20 percent impaired, limited or restricted Mobility: Walking and Moving Around Discharge Status [phone]): At least 1 percent but less than 20 percent impaired, limited or restricted    Reginia Naas 02/24/2015, 1:04 PM  Magda Kiel, [place] 02/24/2015

## 2015-02-24 NOTE — Care Management Note (Signed)
Case Management Note  Patient Details  Name: NAFTULA DONAHUE MRN: [id number] Date of Birth: [birth year]  Subjective/Objective:                    Action/Plan: Plan is to discharge home with wife. PT recommending outpatient PT services and patient would like to attend outpatient PT. CM called and spoke with Dr Kenroy Bridge and MD placed orders for therapy. Order printed and given to patients wife so they can have outpatient therapy closer to home. Patient has a PT he wants to use. Bedside RN updated.  Expected Discharge Date:                  Expected Discharge Plan:     In-House Referral:     Discharge planning Services     Post Acute Care Choice:    Choice offered to:     DME Arranged:    DME Agency:     HH Arranged:    HH Agency:     Status of Service:  In process, will continue to follow  Medicare Important Message Given:    Date Medicare IM Given:    Medicare IM give by:    Date Additional Medicare IM Given:    Additional Medicare Important Message give by:     If discussed at Long Length of Stay Meetings, dates discussed:    Additional Comments:  Kermit Balo, RN 02/24/2015, 4:49 PM

## 2015-02-24 NOTE — Progress Notes (Signed)
Nutrition Brief Note  Patient identified on the Malnutrition Screening Tool (MST) Report  Pt reports losing from 240 lbs to 225 lbs in the past 6 months (6.3% weight loss). Weight loss has been gradual. He reports having a decrease in appetite and eating slightly less than he used to, but he feels his intake is adequate. He denies any nutrition questions or concerns at this time.   Current diet order is Heart Healthy, patient is consuming approximately 100% of meals at this time. Labs and medications reviewed.    No nutrition interventions warranted at this time. If nutrition issues arise, please consult RD.   Dorothea Ogle RD, LDN Inpatient Clinical Dietitian Pager: [phone] After Hours Pager: [phone]

## 2015-02-24 NOTE — H&P (Signed)
Internal Medicine Attending Admission Note Date: 02/24/2015  Patient name: Hector Petersen Medical record number: [id number] Date of birth: Dec 20, 1940 Age: 75 y.o. Gender: male  I saw and evaluated the patient. I reviewed the resident's note and I agree with the resident's findings and plan as documented in the resident's note.  Chief Complaint(s): Syncope  History - key components related to admission:  Hector Petersen is a 75 year old man with a history of hypertension, hyperlipidemia, coronary artery disease status post stenting 10 years ago, depression, and a baseline unsteady gait with several falls recently who presents with acute syncope. He was in his usual state of health until the evening prior to admission when he was sitting in his chair watching TV. He had to urinate and got up and started walking to the bathroom without his walker. Just as he reached the bathroom he lost consciousness falling forward and hitting his face and arms. His family heard him fall and immediately went to him. There were no seizure-like activity or loss of bowel or bladder appreciated. When specifically asked about his sense just prior to the syncopal episode he denied any dizziness, vertigo, headaches, numbness, weakness, visual changes, palpitations, chest pain, or shortness of breath. He has never had a previous episode of true syncope. He was brought to the emergency department and admitted to the internal medicine teaching service for further evaluation after being found to be orthostatic and receiving 1 L of IV fluids.  Upon further questioning he notes that he recently started amitriptyline for insomnia. He has a history of nocturia 4 and was recently placed on tamsulosin. He felt this medication was not benefiting him in any way and so he has not been taking it recently. He has been taking his lisinopril 20 mg by mouth daily but denies any orthostatic symptoms prior to this event. His systolic blood pressures  have mainly been in the 120s at home. This morning he feels well and at his baseline. Overnight telemetry failed to reveal any significant arrhythmias that could be contributing to a arrhythmogenic syncopal episode. Echocardiogram was unremarkable with the exception of grade 1 diastolic dysfunction.  Physical Exam - key components related to admission:  Filed Vitals:   02/24/15 0000 02/24/15 0622 02/24/15 0930 02/24/15 1449  BP: 133/80 139/95 143/94 118/79  Pulse: 74 80 77 81  Temp: 98.2 F (36.8 C) 97.7 F (36.5 C) 98.2 F (36.8 C) 98.6 F (37 C)  TempSrc: Oral Oral Oral Oral  Resp: 16 20 18 18   SpO2: 99% 100% 99% 98%   Gen.: Well-developed, well-nourished, man lying comfortably in bed in no acute distress. 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 Heart: Regular rate and rhythm without murmurs, rubs, or gallops. Extremities: His left pointer finger seemed dislocated and had significant bruising over the DIP. He noted tenderness.  Lab results:  Basic Metabolic Panel:  Recent Labs  13/24/[birth year] 1119 02/24/15 0429  NA 131* 131*  K 4.2 4.5  CL 98* 99*  CO2 26 26  GLUCOSE 145* 115*  BUN 12 10  CREATININE 1.31* 1.16  CALCIUM 9.0 8.5*   Liver Function Tests:  Recent Labs  02/23/15 1120  AST 25  ALT 29  ALKPHOS 81  BILITOT 1.2  PROT 6.7  ALBUMIN 3.7   CBC:  Recent Labs  02/23/15 1119 02/24/15 0429  WBC 6.5 3.7*  HGB 16.5 14.6  HCT 47.1 41.2  MCV 98.3 97.6  PLT 109* 74*   Thyroid Function Tests:  Recent Labs  02/23/15 1920  TSH 1.489   Anemia Panel:  Recent Labs  02/23/15 1922  VITAMINB12 1078*   Urinalysis: Glucose 250, otherwise unremarkable  Imaging results:  Dg Chest 2 View  02/23/2015  CLINICAL DATA:  Fall last night, face and arms abrasion however EXAM: CHEST  2 VIEW COMPARISON:  11/26/2014 FINDINGS: Cardiomediastinal silhouette is stable. Again noted old fracture of the left fifth rib. No acute infiltrate or pulmonary edema. Osteopenia and mild  degenerative changes thoracic spine. No pneumothorax. IMPRESSION: No active disease. Again noted old fracture of the left fifth rib. No infiltrate or pulmonary edema. Electronically Signed   By: Natasha Mead M.D.   On: 02/23/2015 12:17   Ct Head Wo Contrast  02/23/2015  CLINICAL DATA:  Syncope last night with a fall and blow to the face. Initial encounter. EXAM: CT HEAD WITHOUT CONTRAST CT CERVICAL SPINE WITHOUT CONTRAST TECHNIQUE: Multidetector CT imaging of the head and cervical spine was performed following the standard protocol without intravenous contrast. Multiplanar CT image reconstructions of the cervical spine were also generated. COMPARISON:  Head CT scan 05/25/2012. FINDINGS: CT HEAD FINDINGS Hematoma is seen above the left eye. There is some cortical atrophy and chronic microvascular ischemic change. No evidence of acute intracranial abnormality including hemorrhage, infarct, mass lesion, mass effect, midline shift or abnormal extra-axial fluid collection is identified. 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 No pneumocephalus or hydrocephalus. The calvarium is intact. Mucosal thickening is seen in the maxillary sinuses bilaterally and scattered ethmoid air cells. CT CERVICAL SPINE FINDINGS No fracture or malalignment cervical spine is identified. Mild loss of disc space height and endplate spurring are seen at C5-6. Note is made that the occipital condyles and lateral masses of C1 are autologously fused. Lung apices are clear. Paraspinous structures are unremarkable. IMPRESSION: Hematoma above the left eye without underlying fracture or acute intracranial abnormality. No acute abnormality cervical spine. Atrophy and chronic microvascular ischemic change. Circumferential mucosal thickening maxillary sinuses bilaterally. Mild degenerative disease C5-6. Electronically Signed   By: Drusilla Kanner M.D.   On: 02/23/2015 12:17   Ct Cervical Spine Wo  Contrast  02/23/2015  CLINICAL DATA:  Syncope last night with a fall and blow to the face. Initial encounter. EXAM: CT HEAD WITHOUT CONTRAST CT CERVICAL SPINE WITHOUT CONTRAST TECHNIQUE: Multidetector CT imaging of the head and cervical spine was performed following the standard protocol without intravenous contrast. Multiplanar CT image reconstructions of the cervical spine were also generated. COMPARISON:  Head CT scan 05/25/2012. FINDINGS: CT HEAD FINDINGS Hematoma is seen above the left eye. There is some cortical atrophy and chronic microvascular ischemic change. No evidence of acute intracranial abnormality including hemorrhage, infarct, mass lesion, mass effect, midline shift or abnormal extra-axial fluid collection is identified. 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 No pneumocephalus or hydrocephalus. The calvarium is intact. Mucosal thickening is seen in the maxillary sinuses bilaterally and scattered ethmoid air cells. CT CERVICAL SPINE FINDINGS No fracture or malalignment cervical spine is identified. Mild loss of disc space height and endplate spurring are seen at C5-6. Note is made that the occipital condyles and lateral masses of C1 are autologously fused. Lung apices are clear. Paraspinous structures are unremarkable. IMPRESSION: Hematoma above the left eye without underlying fracture or acute intracranial abnormality. No acute abnormality cervical spine. Atrophy and chronic microvascular ischemic change. Circumferential mucosal thickening maxillary sinuses bilaterally. Mild degenerative disease C5-6. Electronically Signed   By: Drusilla Kanner M.D.   On:  02/23/2015 12:17   Dg Hand Complete Left  02/24/2015  CLINICAL DATA:  75 year old male with pain at the index finger distal interphalangeal joint after falling 2 days previously EXAM: LEFT HAND - COMPLETE 3+ VIEW COMPARISON:  None. FINDINGS: Positive for avulsion fracture at the dorsal aspect of the base  of the distal phalanx of the index finger. There is associated surrounding soft tissue swelling. The remainder the visualized bones and joints are unremarkable. IMPRESSION: Acute avulsion fracture at the dorsal aspect of the base of the distal phalanx of the index finger in the region of the extensor tendon insertion. Findings likely reflect hyperflexion injury. Electronically Signed   By: Malachy Moan M.D.   On: 02/24/2015 10:58   Other results:  EKG: Normal sinus tachycardia at 105 bpm, normal axis, normal intervals, deep Q waves in lead III, no LVH, good R wave progression, no ST segment changes, nonspecific inferolateral T wave changes. No comparisons immediately available.  Assessment & Plan by Problem:  Hector Petersen is a 75 year old man with a history of hypertension, hyperlipidemia, coronary artery disease, depression, and unsteady gait who presents with an acute episode of syncope. Evaluation thus far has failed to reveal a cause of his syncopal event although he was orthostatic. He is on several medications that could be problematic including the amitriptyline and the lisinopril. Were he taking the tamsulosin that also could cause orthostatic dizziness and syncope, but he claims he has not been on it recently.  1) Syncope: Workup to date including echocardiogram, telemetry monitoring, ECG, and labs have been unremarkable for an etiology of his syncopal episode. It is likely secondary to orthostatic hypotension either from decreased oral intake or has a side effect of one of his medications including the amitriptyline or the lisinopril. We will decrease the lisinopril dose to 10 mg by mouth daily and discontinue the amitriptyline. We have informed his son and wife about the possibility of tamsulosin causing dizziness and they will assure that he is in fact not taking it.  2) Nocturia likely secondary to benign prostatic hypertrophy: He is currently holding the tamsulosin. We agree with this  given the orthostasis found on admission. If he were to require therapy for his bladder outlet obstructive symptoms from his BPH a trial of finasteride could be considered as this is less likely to cause orthostatic changes.  3) Left pointer finger avulsion fracture: We will ask orthopedic surgery to see him as an outpatient for this fracture he obtained during his fall.  4) Disposition: He is stable for discharge home today with follow-up in orthopedics for his avulsion fracture of the pointer finger on the left and with his primary care provider to reassess blood pressure control and any further symptoms moving forward.

## 2015-02-24 NOTE — Discharge Instructions (Addendum)
You were admitted for fall/syncope. Your echocardiogram (image of the heart) was normal so it is reassuring. Likely it may be medication induced.  Please work with physical therapy at home.   Please stop taking Elavil Please continue to hold the flomax. If you experience symptoms of bladder obstruction like urinary hesitancy, then you can talk to your PCP about starting Finasteride, which is another medicine for enlarged prostate. It is less likely to give you dizziness.   Please cut lisinopril down to 10 mg  Please repeat your renal function test when you go to your PCP- your creatinine was a little elevated indicating that you may have chronic kidney disease.  Please follow up with your PCP regarding above issues

## 2015-02-24 NOTE — Care Management Note (Addendum)
Case Management Note  Patient Details  Name: Hector Petersen MRN: [id number] Date of Birth: 1940-08-13  Subjective/Objective:                    Action/Plan: Patient was admitted with syncope and collapse. Lives at home with wife. Will follow for discharge needs.  Patient DOES have a PCP, Dr Letta Kocher.  Expected Discharge Date:                  Expected Discharge Plan:     In-House Referral:     Discharge planning Services     Post Acute Care Choice:    Choice offered to:     DME Arranged:    DME Agency:     HH Arranged:    HH Agency:     Status of Service:  In process, will continue to follow  Medicare Important Message Given:    Date Medicare IM Given:    Medicare IM give by:    Date Additional Medicare IM Given:    Additional Medicare Important Message give by:     If discussed at Long Length of Stay Meetings, dates discussed:    Additional Comments:  Anda Kraft, RN 02/24/2015, 10:44 AM [phone]

## 2015-02-24 NOTE — Discharge Summary (Signed)
Name: Hector Petersen MRN: [id number] DOB: 01-Nov-1940 75 y.o. PCP: No primary care provider on file.  Date of Admission: 02/23/2015 11:20 AM Date of Discharge: 02/24/2015 Attending Physician: Doneen Poisson, MD  Discharge Diagnosis: 1. Syncope  Principal Problem:   Syncope and collapse Active Problems:   Orthostatic hypotension   HTN (hypertension)   HLD (hyperlipidemia)   H/O TIA (transient ischemic attack) and stroke   Left rib fracture   Depression   Hyponatremia   Senile purpura (HCC)  Discharge Medications:   Medication List    STOP taking these medications        amitriptyline 50 MG tablet  Commonly known as:  ELAVIL     tamsulosin 0.4 MG Caps capsule  Commonly known as:  FLOMAX      TAKE these medications        aspirin EC 81 MG tablet  Take 81 mg by mouth daily.     atorvastatin 10 MG tablet  Commonly known as:  LIPITOR  Take 10 mg by mouth daily.     escitalopram 20 MG tablet  Commonly known as:  LEXAPRO  Take 20 mg by mouth daily.     Fish Oil 1000 MG Caps  Take 1,000 mg by mouth daily.     lisinopril 20 MG tablet  Commonly known as:  PRINIVIL,ZESTRIL  Take 0.5 tablets (10 mg total) by mouth daily.     omeprazole 20 MG capsule  Commonly known as:  PRILOSEC  Take 20 mg by mouth daily.     traZODone 100 MG tablet  Commonly known as:  DESYREL  Take 100-200 mg by mouth at bedtime.     vitamin B-12 1000 MCG tablet  Commonly known as:  CYANOCOBALAMIN  Take 1,000 mcg by mouth daily.        Disposition and follow-up:   Mr.Hector Petersen was discharged from [facility] in Good condition.  At the hospital follow up visit please address:  1.   Left pointer finger avulsion fracture: asked orthopedic surgery to see him as an outpatient for this fracture he obtained during his fall. He is following up with PCP today.   Syncope: We failed to identify a cardiac etiology- telemetry, EKG and cardiac echo unremarkable. Except  cardiac echo showing grade 1 diastolic dysfunction. Please continue to assess for signs of syncope at future follow up visits. We have re-adjusted her meds as listed below.   HTN: we reduced the lisinopril to 10 mg. Please reasses BP and adjust as needed  Nocturia 2/2 BPH: Pt was taking Flomax which could have contributed to orthostatic hypotension. Pt and his wife told me that in the days leading up to the admission, patient was not taking the Flomax.  We asked him to continue holding the Flomax. We suggested that a trial of finasteride may help if his symptoms of BPH recur- this can be discussed with his PCP  Depression- we stopped the Elavil.   Mechanical falls- Inpatient PT recommends outpatient PT- we ordered this.   Hyponatremia: hypotonic hypoNa to 131.  Asymptomatic . No priors for comparison . TSH normal . Unlikely to be SIADH as Urine osm is >100. Serum osm is 268 (low), urine osm 221, urine sodium 49.    Elevated Creatinine: to 1.31. No baseline . Repeat BMET shows creatinine has improved to 1.16. FeNa of exactly 1.0 %- could be prerenal or renal- follow up outpatient    2.  Labs / imaging needed at  time of follow-up:  BMET   3.  Pending labs/ test needing follow-up:   Follow-up Appointments:   Discharge Instructions: Discharge Instructions    Diet - low sodium heart healthy    Complete by:  As directed      Discharge instructions    Complete by:  As directed   Please stop taking Elavil, and do not take Flomax (tamsulosin) Please cut lisinopril down to 10 mg Please follow up with your PCP     Increase activity slowly    Complete by:  As directed            Consultations:    Procedures Performed:  Dg Chest 2 View  02/23/2015  CLINICAL DATA:  Fall last night, face and arms abrasion however EXAM: CHEST  2 VIEW COMPARISON:  11/26/2014 FINDINGS: Cardiomediastinal silhouette is stable. Again noted old fracture of the left fifth rib. No acute infiltrate or pulmonary  edema. Osteopenia and mild degenerative changes thoracic spine. No pneumothorax. IMPRESSION: No active disease. Again noted old fracture of the left fifth rib. No infiltrate or pulmonary edema. Electronically Signed   By: Natasha Mead M.D.   On: 02/23/2015 12:17   Ct Head Wo Contrast  02/23/2015  CLINICAL DATA:  Syncope last night with a fall and blow to the face. Initial encounter. EXAM: CT HEAD WITHOUT CONTRAST CT CERVICAL SPINE WITHOUT CONTRAST TECHNIQUE: Multidetector CT imaging of the head and cervical spine was performed following the standard protocol without intravenous contrast. Multiplanar CT image reconstructions of the cervical spine were also generated. COMPARISON:  Head CT scan 05/25/2012. FINDINGS: CT HEAD FINDINGS Hematoma is seen above the left eye. There is some cortical atrophy and chronic microvascular ischemic change. No evidence of acute intracranial abnormality including hemorrhage, infarct, mass lesion, mass effect, midline shift or abnormal extra-axial fluid collection is identified. 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 No pneumocephalus or hydrocephalus. The calvarium is intact. Mucosal thickening is seen in the maxillary sinuses bilaterally and scattered ethmoid air cells. CT CERVICAL SPINE FINDINGS No fracture or malalignment cervical spine is identified. Mild loss of disc space height and endplate spurring are seen at C5-6. Note is made that the occipital condyles and lateral masses of C1 are autologously fused. Lung apices are clear. Paraspinous structures are unremarkable. IMPRESSION: Hematoma above the left eye without underlying fracture or acute intracranial abnormality. No acute abnormality cervical spine. Atrophy and chronic microvascular ischemic change. Circumferential mucosal thickening maxillary sinuses bilaterally. Mild degenerative disease C5-6. Electronically Signed   By: Drusilla Kanner M.D.   On: 02/23/2015 12:17   Ct Cervical  Spine Wo Contrast  02/23/2015  CLINICAL DATA:  Syncope last night with a fall and blow to the face. Initial encounter. EXAM: CT HEAD WITHOUT CONTRAST CT CERVICAL SPINE WITHOUT CONTRAST TECHNIQUE: Multidetector CT imaging of the head and cervical spine was performed following the standard protocol without intravenous contrast. Multiplanar CT image reconstructions of the cervical spine were also generated. COMPARISON:  Head CT scan 05/25/2012. FINDINGS: CT HEAD FINDINGS Hematoma is seen above the left eye. There is some cortical atrophy and chronic microvascular ischemic change. No evidence of acute intracranial abnormality including hemorrhage, infarct, mass lesion, mass effect, midline shift or abnormal extra-axial fluid collection is identified. 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 No pneumocephalus or hydrocephalus. The calvarium is intact. Mucosal thickening is seen in the maxillary sinuses bilaterally and scattered ethmoid air cells. CT CERVICAL SPINE FINDINGS No fracture  or malalignment cervical spine is identified. Mild loss of disc space height and endplate spurring are seen at C5-6. Note is made that the occipital condyles and lateral masses of C1 are autologously fused. Lung apices are clear. Paraspinous structures are unremarkable. IMPRESSION: Hematoma above the left eye without underlying fracture or acute intracranial abnormality. No acute abnormality cervical spine. Atrophy and chronic microvascular ischemic change. Circumferential mucosal thickening maxillary sinuses bilaterally. Mild degenerative disease C5-6. Electronically Signed   By: Drusilla Kanner M.D.   On: 02/23/2015 12:17   Dg Hand Complete Left  02/24/2015  CLINICAL DATA:  75 year old male with pain at the index finger distal interphalangeal joint after falling 2 days previously EXAM: LEFT HAND - COMPLETE 3+ VIEW COMPARISON:  None. FINDINGS: Positive for avulsion fracture at the dorsal aspect of  the base of the distal phalanx of the index finger. There is associated surrounding soft tissue swelling. The remainder the visualized bones and joints are unremarkable. IMPRESSION: Acute avulsion fracture at the dorsal aspect of the base of the distal phalanx of the index finger in the region of the extensor tendon insertion. Findings likely reflect hyperflexion injury. Electronically Signed   By: Malachy Moan M.D.   On: 02/24/2015 10:58    2D Echo: performed- see hospital course   Cardiac Cath:   Admission HPI:    Mr Rozario is a 75 yo man with history of HTN, HLD, Depression, who is here for a syncopal episode last night. Son present at bedside who also provided the info. Patient states last night he was walking to the bathroom, lost consciousness for few seconds, and fell. He was sitting in the recliner watching TV, and then he got up and while on the way to the bathroom, he felt like this. Denies b/b incontinence or arm twitching. Prior to the fall, patient was sitting watching television. Patient has a history of falls, which increased for last 6 months, but has never passed out before. He denies any associated chest pain or palpitations prior the event. He denies sensation of room spinning. He does admit to a recent history of productive cough with clear sputum. He has not been eating and drinking "like he used to." He says he drank plenty of water yesterday. He currently weighs 225 but several months ago weight about 240 pounds. He denies associated shortness of breath. He denies lightheadedness or dizziness prior to the fall. His wife witness the loss of consciousness and fall. Patient's face hit the floor. He normally walks around by himself. Patient has a walker and cane. Wife is currently battling stomach cancer. He denies dizziness, headaches, SOB, n/v/d/c, melena or hematochezia. No blood loss. He does have some pain on the right side of chest that was after the fall. He denies any  fever or chills. He denies numbness or tingling but mentions that his bottom of his feet felt numb for past few months.  He also says that he feels unsteady when he walks and that has resulted in a lot of falls. In the last 6 months, he has fallen several times. He has been evaluated by PT before and he has a walker at home, but he does not like using a walker, and likes to walk by himself.   In the ER, he was found to be orthostatic and given 1L fluids. CT head was WNL. EKG showed sinus tach.    Social Hx: Patient lives at home with his wife. He admits to alcohol use- usually one drink  a day either beer or wine. He admits to 1 ppd of tobacco use for the last one year. He previously smoked for 40 years then quit, but has been smoking for the past year.   PMHx: HTN, h/o CVA, Depression, HLD  Family Hx: Sister- Hypothyroidism  Hospital Course by problem list: Principal Problem:   Syncope and collapse Active Problems:   Orthostatic hypotension   HTN (hypertension)   HLD (hyperlipidemia)   H/O TIA (transient ischemic attack) and stroke   Left rib fracture   Depression   Hyponatremia   Senile purpura (HCC)   Syncopal episode leading to fall : Prior to the fall, there were no prodromal symptoms and that he was sitting in his recliner watching TV, and then he got up, and felt "unsteady on his legs " but not dizzy, but he got a good balance and he walked to the bathroom, and right when he reached,he suddenly fell forward and hit his head and lost consciousness. There was no dizziness right then, and he did not experience any chest pain or palpitations. He denied diaphoresis or pallor. He stayed adequately hydrated. He was orthostatic in the ER. We ordered echo. His TSH is normal. His medications may also be contributing to the syncopal episode. He was prescribed flomax but not taking it so we will continue to hold it. We asked the patient to stop taking the amitriptyline. PT recommends home  health PT. His orthostatics were negative in the morning . Echo shows EF of 55-60% with no wall motion abnormalities and grade  Diastolic dysfunction. Patient's orthostatics were negative the morning of discharge, ahd he was feeling well. He was evaluated by PT who recommended outpatient physical therapy which was prescribed on discharge. On discharge we asked the patient to stop Elavil and continue to hold flomax   Recurrent mechanical falls: Pt who has fallen multiple times in the past 6 months due to "his legs giving out" Has a walker and cane at home but did not want to use it. Physical exam reveals multiple bruising and band-aids. No concern for abuse at home as son seemed very supportive. Chest xray shows old fracture of left fifth rib. Vitamin B12 level WNL. PT recommends home health PT  Left pointer finger avulsion fracture: asked orthopedic surgery to see him as an outpatient for this fracture he obtained during his fall. He is following up with PCP today  Depression: currently lot of things going on in family- wife dx with gastric ca. Could have contributed to the fall.  Asked to stop Elavil. Continuing trazodone and lexapro.   Hypotonic Hyponatremia: to 131. Asymptomatic . No priors for comparison . TSH normal . Unlikely to be SIADH as Urine osm is >100. Serum osm is 268 (low), urine osm 221, urine sodium 49. He will have follow up BMET with PCP   Elevated Creatinine: to 1.31. No baseline . Repeat BMET shows creatinine has improved to 1.16. FeNa of exactly 1.0 %- could be prerenal or renal- follow up outpatient    HTN: on home lisinopril 20 mg at home. Patient normotensive on exam. Reduced to 10 mg on discharge  GERD- c/w protonix  HLD- c/w lipitor 10 mg daily   Discharge Vitals:   BP 118/79 mmHg  Pulse 81  Temp(Src) 98.6 F (37 C) (Oral)  Resp 18  SpO2 98%  Discharge Labs:  Results for orders placed or performed during the hospital encounter of 02/23/15 (from the past  24 hour(s))  TSH  Status: None   Collection Time: 02/23/15  7:20 PM  Result Value Ref Range   TSH 1.489 0.350 - 4.500 uIU/mL  Osmolality     Status: Abnormal   Collection Time: 02/23/15  7:22 PM  Result Value Ref Range   Osmolality 268 (L) 275 - 295 mOsm/kg  Vitamin B12     Status: Abnormal   Collection Time: 02/23/15  7:22 PM  Result Value Ref Range   Vitamin B-12 1078 (H) 180 - 914 pg/mL  Basic metabolic panel     Status: Abnormal   Collection Time: 02/24/15  4:29 AM  Result Value Ref Range   Sodium 131 (L) 135 - 145 mmol/L   Potassium 4.5 3.5 - 5.1 mmol/L   Chloride 99 (L) 101 - 111 mmol/L   CO2 26 22 - 32 mmol/L   Glucose, Bld 115 (H) 65 - 99 mg/dL   BUN 10 6 - 20 mg/dL   Creatinine, Ser 1.61 0.61 - 1.24 mg/dL   Calcium 8.5 (L) 8.9 - 10.3 mg/dL   GFR calc non Af Amer >60 >60 mL/min   GFR calc Af Amer >60 >60 mL/min   Anion gap 6 5 - 15  CBC     Status: Abnormal   Collection Time: 02/24/15  4:29 AM  Result Value Ref Range   WBC 3.7 (L) 4.0 - 10.5 K/uL   RBC 4.22 4.22 - 5.81 MIL/uL   Hemoglobin 14.6 13.0 - 17.0 g/dL   HCT 09.6 04.5 - 40.9 %   MCV 97.6 78.0 - 100.0 fL   MCH 34.6 (H) 26.0 - 34.0 pg   MCHC 35.4 30.0 - 36.0 g/dL   RDW 81.1 91.4 - 78.2 %   Platelets 74 (L) 150 - 400 K/uL  Sodium, urine, random     Status: None   Collection Time: 02/24/15  4:41 AM  Result Value Ref Range   Sodium, Ur 49 mmol/L  Creatinine, urine, random     Status: None   Collection Time: 02/24/15  4:41 AM  Result Value Ref Range   Creatinine, Urine 47.93 mg/dL  Osmolality, urine     Status: Abnormal   Collection Time: 02/24/15  4:41 AM  Result Value Ref Range   Osmolality, Ur 221 (L) 300 - 900 mOsm/kg    Signed: Deneise Lever, MD 02/24/2015, 3:42 PM    Services Ordered on Discharge: outpatient PT Equipment Ordered on Discharge:

## 2015-02-24 NOTE — Evaluation (Addendum)
Occupational Therapy Evaluation Patient Details Name: Hector Petersen MRN: [id number] DOB: 1940/02/03 Today's Date: 02/24/2015    History of Present Illness Hector Petersen is a 75 y.o. man with history of HTN, TIA, stroke, Hypercholesteremia, HLD, Depression, who is here for a syncopal episode. History of falls. Chest xray shows old fracture of left fifth rib    Clinical Impression   Pt admitted with above. Pt independent with ADLs, PTA. Feel pt will benefit from acute OT to increase independence and strength prior to d/c.   Follow Up Recommendations  No OT follow up;Supervision/Assistance - 24 hour    Equipment Recommendations    shower chair, but pt not wanting one   Recommendations for Other Services       Precautions / Restrictions Precautions Precautions: Fall Precaution Comments: history of falls Restrictions Weight Bearing Restrictions: No      Mobility Bed Mobility General bed mobility comments: pt sitting EOB  Transfers Overall transfer level: Needs assistance   Transfers: Sit to/from Stand Sit to Stand: Supervision           Balance  LOB when stepping over simulated tub-assist given; history of falls                                   ADL Overall ADL's : Needs assistance/impaired Eating/Feeding: Sitting;Independent                   Lower Body Dressing: Min guard;Sit to/from stand   Toilet Transfer: Min guard;Ambulation (sit to stand from bed)       Tub/ Shower Transfer: Tub transfer;Moderate assistance;Ambulation   Functional mobility during ADLs:  (Min guard with exception of tub transfer) General ADL Comments: Educated on safety such as safe footwear, sitting for LB ADLs, rugs/items on floor, and recommended wife be with him for tub transfer and bathing. Discussed options for shower chair, but pt not wanting a shower chair.     Vision  Pt wears glasses all the time; reports no change from baseline   Perception      Praxis      Pertinent Vitals/Pain Pain Assessment: 0-10 Pain Score: 7  Pain Location: left side when coughing Pain Intervention(s): Monitored during session   HR up to 121 in session but trended down.     Hand Dominance Right   Extremity/Trunk Assessment Upper Extremity Assessment Upper Extremity Assessment: Generalized weakness   Lower Extremity Assessment Lower Extremity Assessment: Defer to PT evaluation RLE Deficits / Details: AROM WFL, strength hip flexion 4-/5, knee extension 4+/5, ankle DF 4+/5, coordination intact to toe tapping and heel to shin RLE Sensation: history of peripheral neuropathy LLE Deficits / Details: AROM WFL, strength hip flexion 4/5, knee extension 4+/5, ankle DF 4+/5, coordination intact to toe tapping and heel to shin LLE Sensation: history of peripheral neuropathy       Communication Communication Communication: HOH   Cognition Arousal/Alertness: Awake/alert Behavior During Therapy: WFL for tasks assessed/performed Overall Cognitive Status: Within Functional Limits for tasks assessed                     General Comments       Exercises       Shoulder Instructions      Home Living Family/patient expects to be discharged to:: Private residence Living Arrangements: Spouse/significant other Available Help at Discharge: Family Type of Home: House Home Access: Stairs to enter Entrance  Stairs-Number of Steps: 3 Entrance Stairs-Rails: None (son can install rails) Home Layout: Able to live on main level with bedroom/bathroom;Laundry or work area in basement;One level     Bathroom Shower/Tub: Chief Strategy Officer: Standard     Home Equipment: Grab bars - tub/shower;Cane - single point;Walker - 2 wheels;Walker - 4 wheels          Prior Functioning/Environment Level of Independence: Independent             OT Diagnosis: Generalized weakness;Other (comment) (decreased balance)   OT Problem List: Decreased  strength;Pain;Decreased knowledge of precautions;Decreased safety awareness;Decreased activity tolerance;Impaired balance (sitting and/or standing)   OT Treatment/Interventions: Therapeutic exercise;Self-care/ADL training;Therapeutic activities;Patient/family education;Balance training;DME and/or AE instruction    OT Goals(Current goals can be found in the care plan section) Acute Rehab OT Goals Patient Stated Goal: not stated OT Goal Formulation: With patient Time For Goal Achievement: 03/03/15 Potential to Achieve Goals: Good ADL Goals Pt Will Perform Tub/Shower Transfer: Tub transfer;with supervision;ambulating Additional ADL Goal #1: Pt will independently perform HEP for bilateral UEs to increase strength.  OT Frequency: Min 2X/week   Barriers to D/C:            Co-evaluation              End of Session    Activity Tolerance: Patient tolerated treatment well Patient left: with call bell/phone within reach;with bed alarm set (sitting EOB)   Time: 4401-0272 OT Time Calculation (min): 17 min Charges:  OT General Charges $OT Visit: 1 Procedure OT Evaluation $OT Eval Moderate Complexity: 1 Procedure G-Codes: OT G-codes **NOT FOR INPATIENT CLASS** Functional Assessment Tool Used: clinical judgment Functional Limitation: Self care Self Care Current Status (Z3664): At least 1 percent but less than 20 percent impaired, limited or restricted Self Care Goal Status (Q0347): At least 1 percent but less than 20 percent impaired, limited or restricted  Earlie Raveling OTR/L [phone] 02/24/2015, 1:16 PM
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
## 2015-02-24 NOTE — Progress Notes (Signed)
  Echocardiogram 2D Echocardiogram has been performed.  Hector Petersen 02/24/2015, 2:36 PM

## 2016-11-19 IMAGING — DX DG CHEST 2V
3 series · 3 of 3 positions shown · non-contrast
Comparison: 11/26/2014

CLINICAL DATA: Fall last night, face and arms abrasion however

EXAM:
CHEST  2 VIEW

[chest lat (1 of 2)]
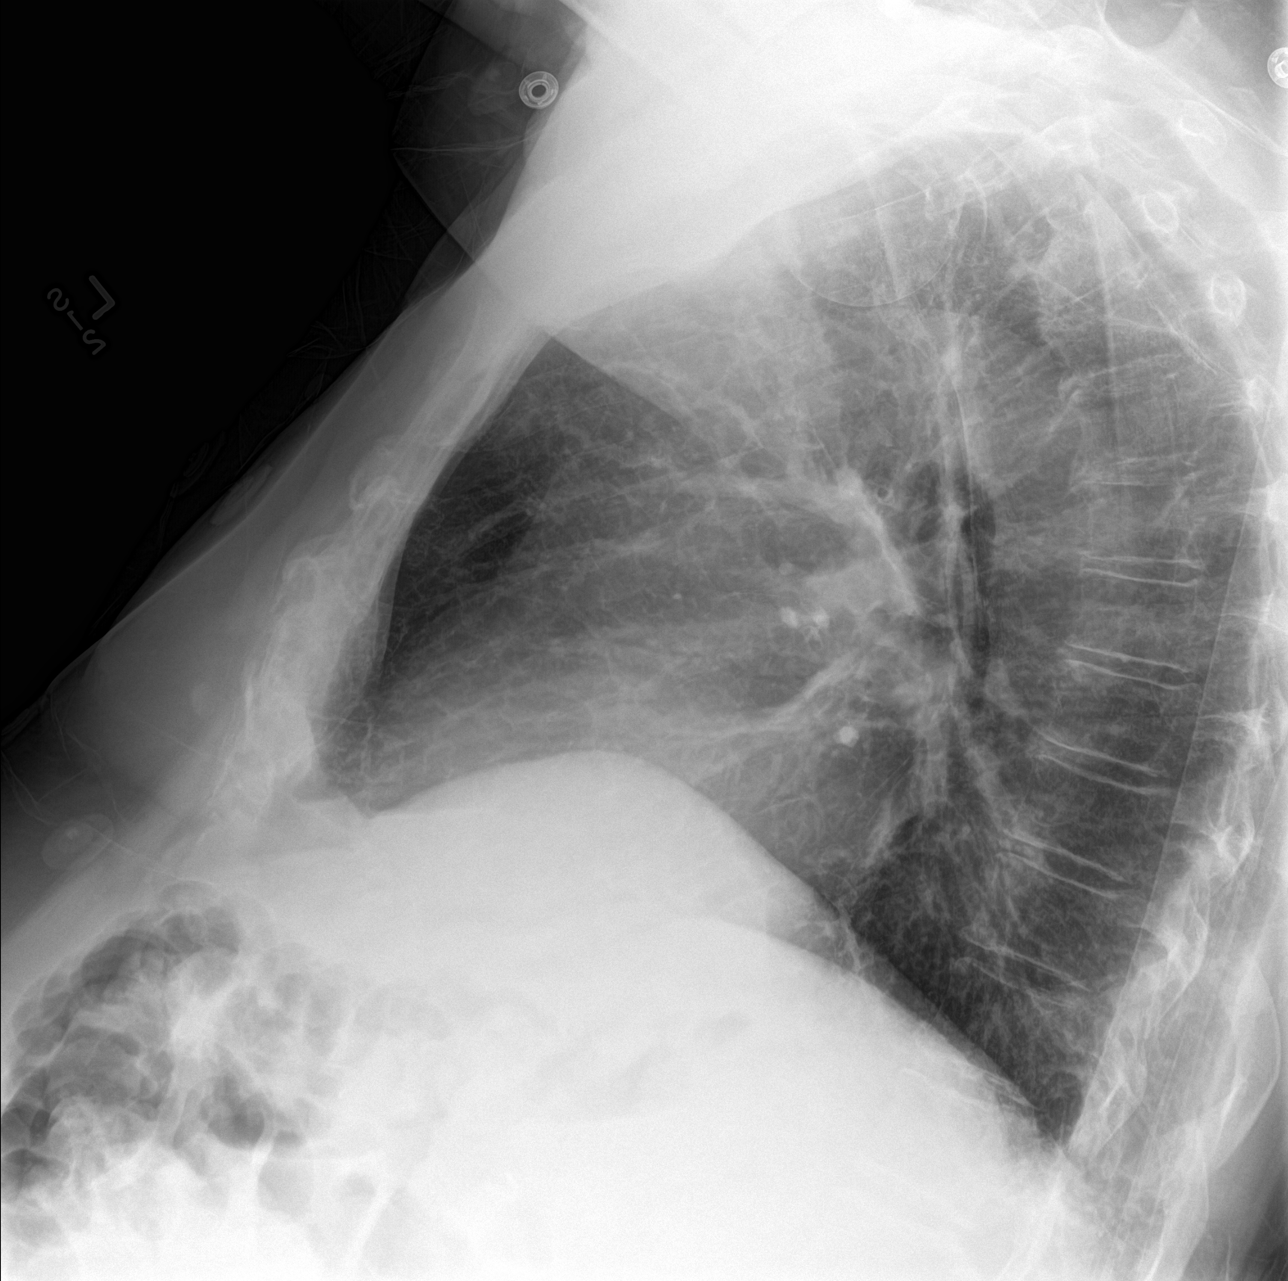

[chest ap]
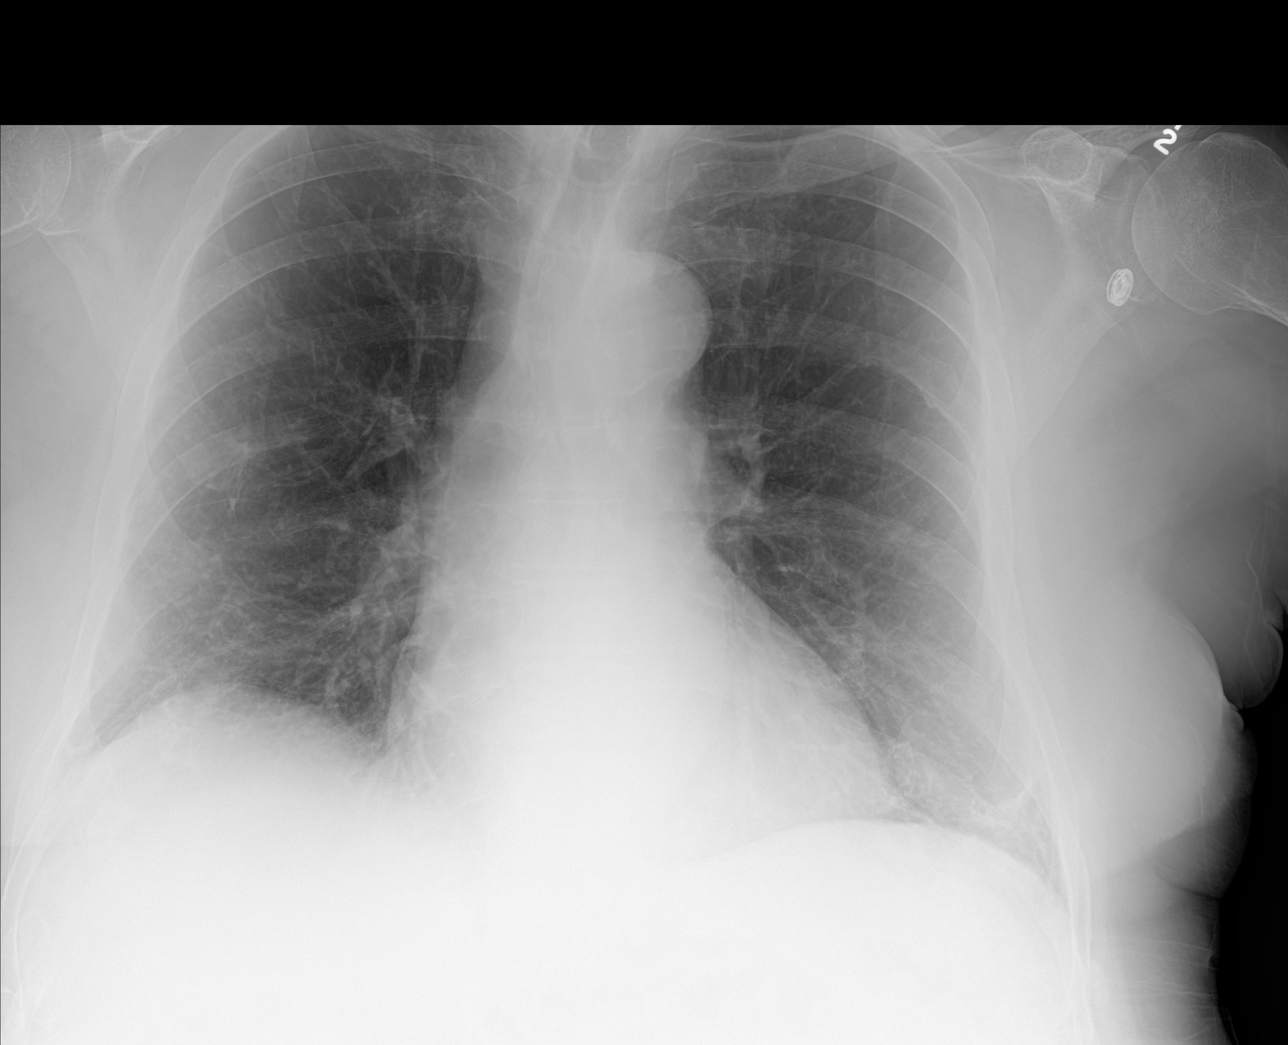

[chest lat (2 of 2)]
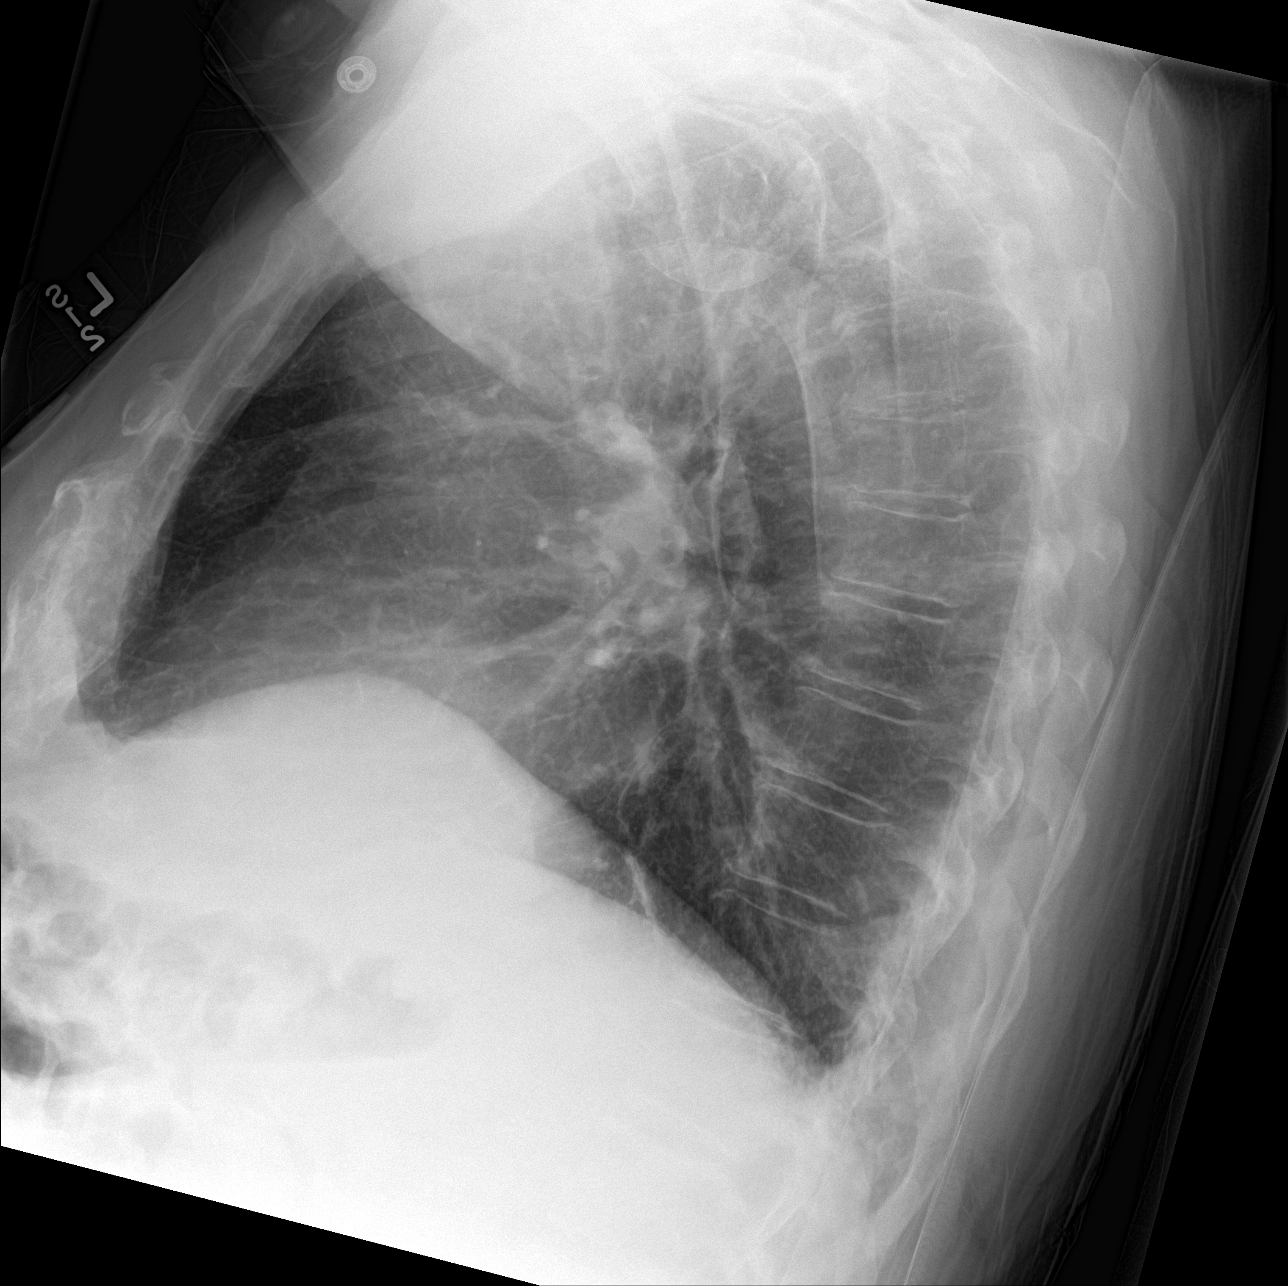

[3 of 3 positions shown; findings below may reference images not displayed]

FINDINGS: Cardiomediastinal silhouette is stable. Again noted old fracture of
the left fifth rib. No acute infiltrate or pulmonary edema.
Osteopenia and mild degenerative changes thoracic spine. No
pneumothorax.
IMPRESSION: No active disease. Again noted old fracture of the left fifth rib.
No infiltrate or pulmonary edema.

## 2016-11-20 IMAGING — CR DG HAND COMPLETE 3+V*L*
3 series · 3 of 3 positions shown · non-contrast
Comparison: None.

CLINICAL DATA: 74-year-old male with pain at the index finger
distal interphalangeal joint after falling 2 days previously

EXAM:
LEFT HAND - COMPLETE 3+ VIEW

[hand pa]
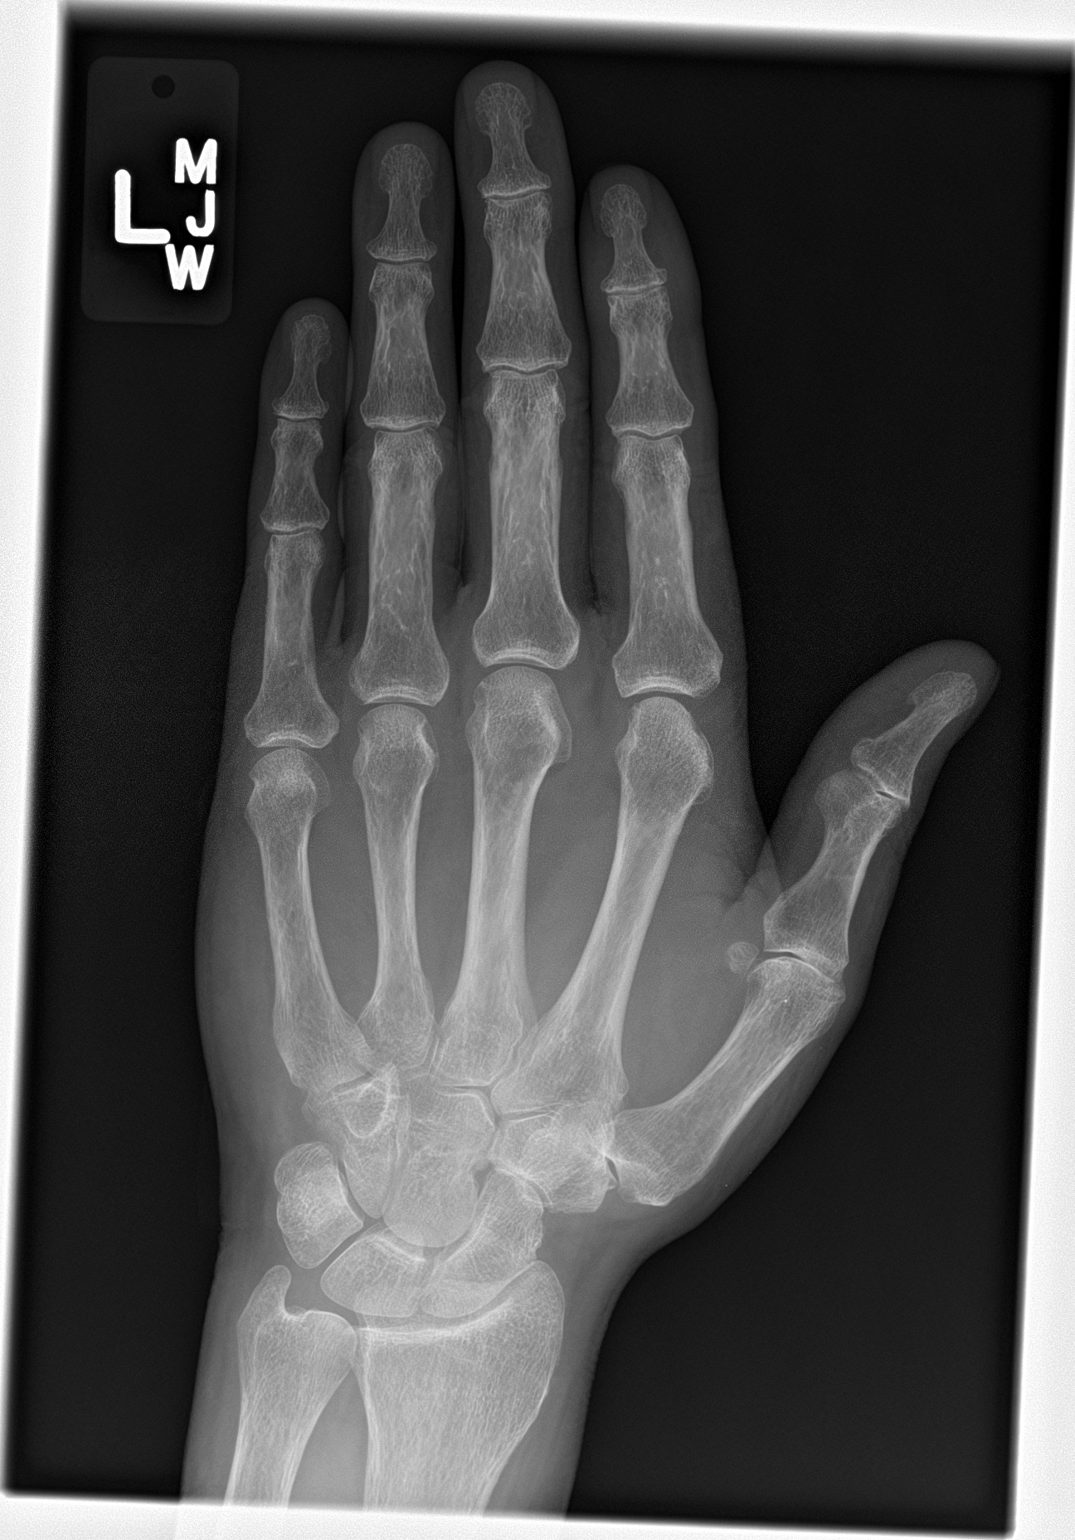

[hand obl]
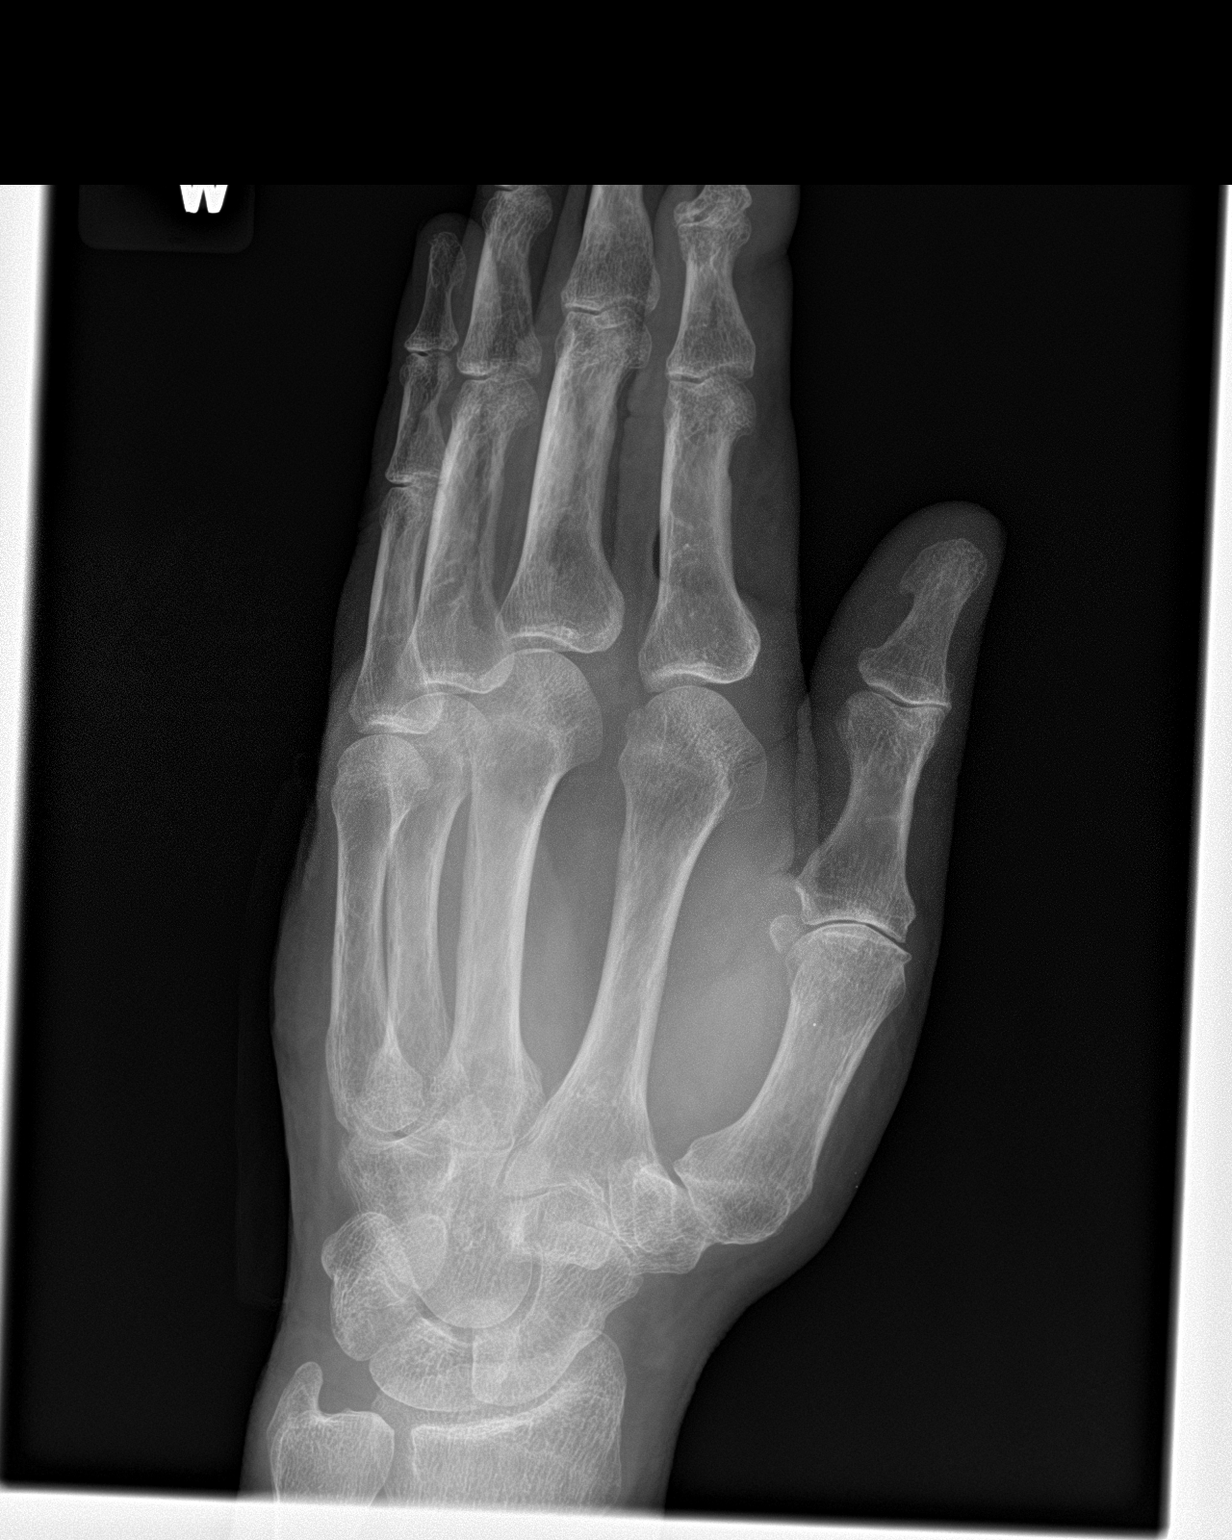

[hand lat]
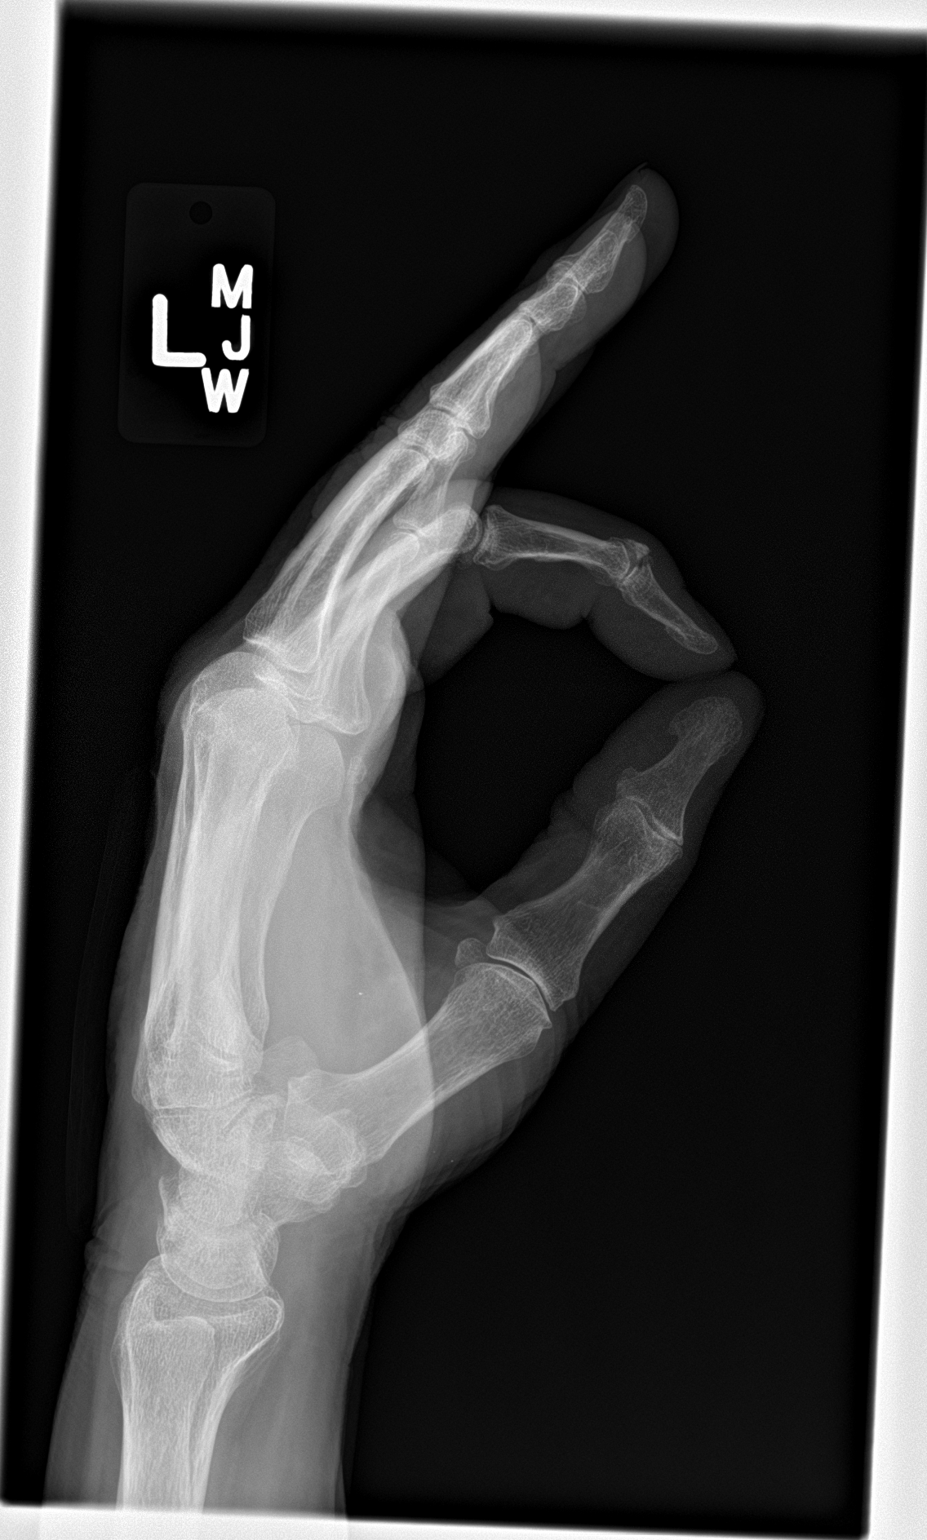

[3 of 3 positions shown; findings below may reference images not displayed]

FINDINGS: Positive for avulsion fracture at the dorsal aspect of the base of
the distal phalanx of the index finger. There is associated
surrounding soft tissue swelling. The remainder the visualized bones
and joints are unremarkable.
IMPRESSION: Acute avulsion fracture at the dorsal aspect of the base of the
distal phalanx of the index finger in the region of the extensor
tendon insertion. Findings likely reflect hyperflexion injury.

## 2020-03-23 DEATH — deceased
